# Patient Record
Sex: Male | Born: 1985
Health system: Southern US, Community
[De-identification: ages and names within clinical notes are randomized; demographics above are authoritative.]

## PROBLEM LIST (undated history)

## (undated) ENCOUNTER — Ambulatory Visit (HOSPITAL_COMMUNITY): Admission: EM | Payer: Medicaid Other | Source: Home / Self Care

## (undated) HISTORY — PX: NO PAST SURGERIES: SHX2092

---

## 2017-10-18 ENCOUNTER — Encounter: Payer: Self-pay | Admitting: Emergency Medicine

## 2017-10-18 ENCOUNTER — Emergency Department: Payer: Self-pay

## 2017-10-18 ENCOUNTER — Emergency Department
Admission: EM | Admit: 2017-10-18 | Discharge: 2017-10-18 | Disposition: A | Discharge status: Home / Self Care | Payer: Self-pay | Attending: Emergency Medicine | Admitting: Emergency Medicine

## 2017-10-18 DIAGNOSIS — Y929 Unspecified place or not applicable: Secondary | ICD-10-CM | POA: Insufficient documentation

## 2017-10-18 DIAGNOSIS — S62355A Nondisplaced fracture of shaft of fourth metacarpal bone, left hand, initial encounter for closed fracture: Secondary | ICD-10-CM | POA: Insufficient documentation

## 2017-10-18 DIAGNOSIS — Y9389 Activity, other specified: Secondary | ICD-10-CM | POA: Insufficient documentation

## 2017-10-18 DIAGNOSIS — F17200 Nicotine dependence, unspecified, uncomplicated: Secondary | ICD-10-CM | POA: Insufficient documentation

## 2017-10-18 DIAGNOSIS — R2232 Localized swelling, mass and lump, left upper limb: Secondary | ICD-10-CM | POA: Insufficient documentation

## 2017-10-18 DIAGNOSIS — Y998 Other external cause status: Secondary | ICD-10-CM | POA: Insufficient documentation

## 2017-10-18 NOTE — Discharge Instructions (Addendum)
You have broken the bone in the hand. Wear the splint until you are cleared by ortho. Keep the splint clean and dry. Take OTC ibuprofen as needed for pain relief.
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
## 2018-06-18 ENCOUNTER — Emergency Department (HOSPITAL_COMMUNITY)
Admission: EM | Admit: 2018-06-18 | Discharge: 2018-06-18 | Disposition: A | Discharge status: Home / Self Care | Payer: Self-pay | Attending: Emergency Medicine | Admitting: Emergency Medicine

## 2018-06-18 ENCOUNTER — Encounter (HOSPITAL_COMMUNITY): Payer: Self-pay | Admitting: *Deleted

## 2018-06-18 ENCOUNTER — Other Ambulatory Visit: Payer: Self-pay

## 2018-06-18 DIAGNOSIS — F17200 Nicotine dependence, unspecified, uncomplicated: Secondary | ICD-10-CM | POA: Insufficient documentation

## 2018-06-18 DIAGNOSIS — L600 Ingrowing nail: Secondary | ICD-10-CM | POA: Insufficient documentation

## 2018-06-18 MED ORDER — LIDOCAINE HCL 2 % IJ SOLN
20.0000 mL | Freq: Once | INTRAMUSCULAR | Status: DC
  Filled 2018-06-18: qty 20

## 2018-06-18 NOTE — ED Triage Notes (Signed)
C/o pain right great toe, thinks he has an ingrown toe nail for over a year. States no change in pain just wants to see whats is wrong

## 2018-06-18 NOTE — Discharge Instructions (Addendum)
Wash daily with soap and water. Reapply a new dressing each day.  Use tylenol, ibuprofen, and ice to help with pain and swelling.  Follow up with the foot doctor if your pain continues, if you have recurrent ingrown toenails.  Return to the ER with any new, worsening, or concerning symptoms.

## 2018-06-18 NOTE — ED Provider Notes (Signed)
[facility] EMERGENCY DEPARTMENT Provider Note   CSN: [id number] Arrival date & time: 06/18/18  [id number]    History   Chief Complaint Chief Complaint  Patient presents with  . Toe Pain    HPI Bobby Jenkins is a 33 y.o. male presenting for evaluation of right great toe pain.  Patient states possible years he has had persistent pain of his right great toenail, along the medial edge.  He states over the past several days, pain has worsened, and when he is walking he has shooting pain that goes from his toe to his midfoot on the dorsal aspect.  He denies pus draining from the area.  He denies fevers, chills, streaking.  He denies numbness or tingling.  He denies pain of other toes.  He has no other medical problems, takes no medications daily.  He is not on blood thinners.     HPI  History reviewed. No pertinent past medical history.  There are no active problems to display for this patient.   History reviewed. No pertinent surgical history.      Home Medications    Prior to Admission medications   Not on File    Family History No family history on file.  Social History Social History   Tobacco Use  . Smoking status: Current Every Day Smoker  . Smokeless tobacco: Never Used  Substance Use Topics  . Alcohol use: Not Currently  . Drug use: Never     Allergies   Patient has no allergy information on record.   Review of Systems Review of Systems  Musculoskeletal:       R toe pain  Hematological: Does not bruise/bleed easily.     Physical Exam Updated Vital Signs BP 122/70 (BP Location: Right Arm)   Pulse 62   Temp 97.7 F (36.5 C) (Oral)   Resp 16   Ht 5\' 9"  (1.753 m)   Wt 79.4 kg   SpO2 100%   BMI 25.84 kg/m   Physical Exam Vitals signs and nursing note reviewed.  Constitutional:      General: He is not in acute distress.    Appearance: He is well-developed.  HENT:     Head: Normocephalic and atraumatic.  Neck:   Musculoskeletal: Normal range of motion.  Pulmonary:     Effort: Pulmonary effort is normal.  Abdominal:     General: There is no distension.  Musculoskeletal: Normal range of motion.     Comments: Mild tenderness palpation of the extensor tendon of the R great toe.  No erythema or warmth.  Pedal pulses intact bilaterally.  Good cap refill of all toes distally.  Good distal sensation.  Skin:    General: Skin is warm.     Capillary Refill: Capillary refill takes less than 2 seconds.     Findings: Erythema present. No rash.     Comments: Erythema and tenderness along the medial aspect of the right great toe.  Neurological:     Mental Status: He is alert and oriented to person, place, and time.      ED Treatments / Results  Labs (all labs ordered are listed, but only abnormal results are displayed) Labs Reviewed - No data to display  EKG None  Radiology No results found.  Procedures Excise ingrown toenail Date/Time: 06/18/2018 1:57 PM Performed by: Alveria Apleyaccavale, Zeven Kocak, PA-C Authorized by: Alveria Apleyaccavale, Conner Neiss, PA-C  Consent: Verbal consent obtained. Risks and benefits: risks, benefits and alternatives were discussed Consent given by: patient Patient  understanding: patient states understanding of the procedure being performed Patient consent: the patient's understanding of the procedure matches consent given Procedure consent: procedure consent matches procedure scheduled Patient identity confirmed: verbally with patient Preparation: Patient was prepped and draped in the usual sterile fashion. Local anesthesia used: yes Anesthesia: digital block  Anesthesia: Local anesthesia used: yes Local Anesthetic: lidocaine 2% without epinephrine Anesthetic total: 6 mL  Sedation: Patient sedated: no  Patient tolerance: Patient tolerated the procedure well with no immediate complications Comments: Digital block performed using ring technique. Nail separated from nail bed using suture  scissors, and medial aspect of the nail removed.    (including critical care time)  Medications Ordered in ED Medications  lidocaine (XYLOCAINE) 2 % (with pres) injection 400 mg (has no administration in time range)     Initial Impression / Assessment and Plan / ED Course  I have reviewed the triage vital signs and the nursing notes.  Pertinent labs & imaging results that were available during my care of the patient were reviewed by me and considered in my medical decision making (see chart for details).        Patient presenting for evaluation of ingrown toenail on the right great toe.  Physical exam reassuring, he is neurovascularly intact.  Additionally, he is having shooting pain to the dorsal aspect of his foot.  Pain is reproducible with palpation of the extensor tendon.  I think this is likely an overuse injury, as patient states he is walking abnormally due to his right great toe pain.  Without a fall or injury, doubt fracture dislocation, I do not believe x-rays would be beneficial.  Ingrown toenail removed as described above.  Patient tolerated well.  Discussed likely overuse injury due to walking abnormally, and he can treat this with Tylenol/ibuprofen.  Discussed aftercare instructions, and follow-up with podiatry.  At this time, patient appears safe for discharge.  Return precautions given.  Patient states he understands agrees plan.   Final Clinical Impressions(s) / ED Diagnoses   Final diagnoses:  Ingrown toenail    ED Discharge Orders    None       Franchot Heidelberg, PA-C 06/18/18 1400    Sherwood Gambler, MD 06/21/18 [phone]

## 2018-09-27 ENCOUNTER — Emergency Department (HOSPITAL_COMMUNITY)
Admission: EM | Admit: 2018-09-27 | Discharge: 2018-09-27 | Disposition: A | Discharge status: Home / Self Care | Payer: Self-pay | Attending: Emergency Medicine | Admitting: Emergency Medicine

## 2018-09-27 ENCOUNTER — Encounter (HOSPITAL_COMMUNITY): Payer: Self-pay | Admitting: Emergency Medicine

## 2018-09-27 ENCOUNTER — Emergency Department (HOSPITAL_COMMUNITY): Payer: Self-pay

## 2018-09-27 ENCOUNTER — Other Ambulatory Visit: Payer: Self-pay

## 2018-09-27 DIAGNOSIS — R0789 Other chest pain: Secondary | ICD-10-CM | POA: Insufficient documentation

## 2018-09-27 DIAGNOSIS — F17200 Nicotine dependence, unspecified, uncomplicated: Secondary | ICD-10-CM | POA: Insufficient documentation

## 2018-09-27 LAB — PROTIME-INR
INR: 0.9 (ref 0.8–1.2)
Prothrombin Time: 12 seconds (ref 11.4–15.2)

## 2018-09-27 LAB — BASIC METABOLIC PANEL
Anion gap: 11 (ref 5–15)
BUN: 12 mg/dL (ref 6–20)
CO2: 24 mmol/L (ref 22–32)
Calcium: 9.1 mg/dL (ref 8.9–10.3)
Chloride: 105 mmol/L (ref 98–111)
Creatinine, Ser: 1.22 mg/dL (ref 0.61–1.24)
GFR calc Af Amer: >60 mL/min (ref >60)
GFR calc non Af Amer: >60 mL/min (ref >60)
Glucose, Bld: 102 mg/dL — ABNORMAL HIGH (ref 70–99)
Potassium: 3.8 mmol/L (ref 3.5–5.1)
Sodium: 140 mmol/L (ref 135–145)

## 2018-09-27 LAB — CBC
HCT: 44.9 % (ref 39.0–52.0)
Hemoglobin: 13.9 g/dL (ref 13.0–17.0)
MCH: 26.7 pg (ref 26.0–34.0)
MCHC: 31 g/dL (ref 30.0–36.0)
MCV: 86.3 fL (ref 80.0–100.0)
Platelets: 218 10*3/uL (ref 150–400)
RBC: 5.2 MIL/uL (ref 4.22–5.81)
RDW: 13.3 % (ref 11.5–15.5)
WBC: 8.2 10*3/uL (ref 4.0–10.5)
nRBC: 0 % (ref 0.0–0.2)

## 2018-09-27 LAB — TROPONIN I (HIGH SENSITIVITY)
Troponin I (High Sensitivity): 3 ng/L (ref <18)
Troponin I (High Sensitivity): 3 ng/L (ref <18)

## 2018-09-27 MED ORDER — IBUPROFEN 800 MG PO TABS: 800.0000 mg | ORAL_TABLET | Freq: Three times a day (TID) | ORAL | 0 refills | Status: DC | PRN

## 2018-09-27 NOTE — ED Notes (Signed)
Pt given dc instructions pt verbalizes understanding.  

## 2018-09-27 NOTE — ED Triage Notes (Signed)
Patient reports intermittent left mild chest pain for 1 week , denies SOB , no emesis or diaphoresis , rates pain 2/10 at triage , patient added bilateral feet swelling today .

## 2018-09-27 NOTE — Discharge Instructions (Addendum)
Your testing here today did not show any significant abnormalities.  I feel that this is pain in the chest wall itself and not from your heart.  Return here for any worsening in your condition.  You can use ice and heat over the areas that are sore.  The testing on your heart did not show any heart related damage today.

## 2018-09-30 NOTE — ED Provider Notes (Signed)
[facility]ERGENC[facility] Provider Note   CSN: [id number] Arrival date & time: 09/27/18  0113     History   Chief Complaint Chief Complaint  Patient presents with  . Chest Pain    HPI Stevan BornMarquis Dimattia is a 33 y.o. male.     HPI Patient presents to the emergency department with left-sided chest pain over the last week.  The patient denies any other symptoms such as shortness of breath emesis or diaphoresis.  The patient states his pain is about a 2 out of 10.  He states that certain movements will make the pain worse.  The patient states that he noticed that he had swelling in his feet several days ago but that is resolved.  Patient states that nothing seems to make the condition better.  He states he did not take any medications prior to arrival for his symptoms.  Patient states he does not smoke and has no known risk factors for cardiac disease.  The patient denies shortness of breath, headache,blurred vision, neck pain, fever, cough, weakness, numbness, dizziness, anorexia, edema, abdominal pain, nausea, vomiting, diarrhea, rash, back pain, dysuria, hematemesis, bloody stool, near syncope, or syncope. History reviewed. No pertinent past medical history.  There are no active problems to display for this patient.   History reviewed. No pertinent surgical history.      Home Medications    Prior to Admission medications   Medication Sig Start Date End Date Taking? Authorizing Provider  ibuprofen (ADVIL) 800 MG tablet Take 1 tablet (800 mg total) by mouth every 8 (eight) hours as needed. 09/27/18   Nasha Diss, Cristal Deerhristopher, PA-C    Family History No family history on file.  Social History Social History   Tobacco Use  . Smoking status: Current Every Day Smoker  . Smokeless tobacco: Never Used  Substance Use Topics  . Alcohol use: Not Currently  . Drug use: Never     Allergies   Patient has no known allergies.   Review of Systems Review of  Systems All other systems negative except as documented in the HPI. All pertinent positives and negatives as reviewed in the HPI.  Physical Exam Updated Vital Signs BP 118/64 (BP Location: Right Arm)   Pulse (!) 50   Temp 98.3 F (36.8 C) (Oral)   Resp 16   Ht 5\' 9"  (1.753 m)   Wt 85 kg   SpO2 97%   BMI 27.67 kg/m   Physical Exam Vitals signs and nursing note reviewed.  Constitutional:      General: He is not in acute distress.    Appearance: He is well-developed.  HENT:     Head: Normocephalic and atraumatic.  Eyes:     Pupils: Pupils are equal, round, and reactive to light.  Neck:     Musculoskeletal: Normal range of motion and neck supple.  Cardiovascular:     Rate and Rhythm: Normal rate and regular rhythm.     Heart sounds: Normal heart sounds. No murmur. No friction rub. No gallop.   Pulmonary:     Effort: Pulmonary effort is normal. No respiratory distress.     Breath sounds: Normal breath sounds. No wheezing or rhonchi.  Chest:     Chest wall: Tenderness present.  Abdominal:     General: Bowel sounds are normal. There is no distension.     Palpations: Abdomen is soft.     Tenderness: There is no abdominal tenderness.  Skin:    General: Skin is warm  and dry.     Capillary Refill: Capillary refill takes less than 2 seconds.     Findings: No erythema or rash.  Neurological:     Mental Status: He is alert and oriented to person, place, and time.     Motor: No abnormal muscle tone.     Coordination: Coordination normal.  Psychiatric:        Behavior: Behavior normal.      ED Treatments / Results  Labs (all labs ordered are listed, but only abnormal results are displayed) Labs Reviewed  BASIC METABOLIC PANEL - Abnormal; Notable for the following components:      Result Value   Glucose, Bld 102 (*)    All other components within normal limits  CBC  PROTIME-INR  TROPONIN I (HIGH SENSITIVITY)  TROPONIN I (HIGH SENSITIVITY)    EKG EKG Interpretation   Date/Time:  Thursday September 27 2018 01:18:15 EDT Ventricular Rate:  55 PR Interval:  132 QRS Duration: 106 QT Interval:  386 QTC Calculation: 369 R Axis:   47 Text Interpretation:  Sinus bradycardia Septal infarct , age undetermined T wave abnormality, consider inferior ischemia Abnormal ECG No old tracing to compare Confirmed by Isla Pence [phone]) on 09/27/2018 8:00:18 AM   Radiology No results found.  Procedures Procedures (including critical care time)  Medications Ordered in ED Medications - No data to display   Initial Impression / Assessment and Plan / ED Course  I have reviewed the triage vital signs and the nursing notes.  Pertinent labs & imaging results that were available during my care of the patient were reviewed by me and considered in my medical decision making (see chart for details).        Patient does have chest wall tenderness on palpation.  I feel he is a chest wall strain.  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  I feel that the patient will need to follow-up with a primary care doctor.  He is given treatment for chest wall strain.  I advised the patient to return here for any worsening his condition.  His enzymes for cardiac injury were negative here today.  EKG does not show any acute abnormalities.  Final Clinical Impressions(s) / ED Diagnoses   Final diagnoses:  Atypical chest pain  Chest wall pain    ED Discharge Orders         Ordered    ibuprofen (ADVIL) 800 MG tablet  Every 8 hours PRN     09/27/18 0824           Dalia Heading, PA-C 10/01/18 0000    Isla Pence, MD 10/02/18 [phone]

## 2019-03-09 ENCOUNTER — Encounter (HOSPITAL_COMMUNITY): Payer: Self-pay | Admitting: Emergency Medicine

## 2019-03-09 ENCOUNTER — Other Ambulatory Visit: Payer: Self-pay

## 2019-03-09 ENCOUNTER — Emergency Department (HOSPITAL_COMMUNITY)
Admission: EM | Admit: 2019-03-09 | Discharge: 2019-03-09 | Disposition: A | Discharge status: Home / Self Care | Payer: Self-pay | Attending: Emergency Medicine | Admitting: Emergency Medicine

## 2019-03-09 DIAGNOSIS — R7401 Elevation of levels of liver transaminase levels: Secondary | ICD-10-CM

## 2019-03-09 DIAGNOSIS — F17200 Nicotine dependence, unspecified, uncomplicated: Secondary | ICD-10-CM | POA: Insufficient documentation

## 2019-03-09 DIAGNOSIS — R1110 Vomiting, unspecified: Secondary | ICD-10-CM | POA: Insufficient documentation

## 2019-03-09 DIAGNOSIS — R748 Abnormal levels of other serum enzymes: Secondary | ICD-10-CM | POA: Insufficient documentation

## 2019-03-09 DIAGNOSIS — R197 Diarrhea, unspecified: Secondary | ICD-10-CM | POA: Insufficient documentation

## 2019-03-09 DIAGNOSIS — N289 Disorder of kidney and ureter, unspecified: Secondary | ICD-10-CM | POA: Insufficient documentation

## 2019-03-09 LAB — COMPREHENSIVE METABOLIC PANEL WITH GFR
ALT: 53 U/L — ABNORMAL HIGH (ref 0–44)
AST: 30 U/L (ref 15–41)
Albumin: 4 g/dL (ref 3.5–5.0)
Alkaline Phosphatase: 54 U/L (ref 38–126)
Anion gap: 8 (ref 5–15)
BUN: 12 mg/dL (ref 6–20)
CO2: 26 mmol/L (ref 22–32)
Calcium: 9 mg/dL (ref 8.9–10.3)
Chloride: 106 mmol/L (ref 98–111)
Creatinine, Ser: 1.32 mg/dL — ABNORMAL HIGH (ref 0.61–1.24)
GFR calc Af Amer: >60 mL/min
GFR calc non Af Amer: >60 mL/min
Glucose, Bld: 120 mg/dL — ABNORMAL HIGH (ref 70–99)
Potassium: 3.5 mmol/L (ref 3.5–5.1)
Sodium: 140 mmol/L (ref 135–145)
Total Bilirubin: 0.6 mg/dL (ref 0.3–1.2)
Total Protein: 6.5 g/dL (ref 6.5–8.1)

## 2019-03-09 LAB — CBC
HCT: 45.6 % (ref 39.0–52.0)
Hemoglobin: 14.5 g/dL (ref 13.0–17.0)
MCH: 27.1 pg (ref 26.0–34.0)
MCHC: 31.8 g/dL (ref 30.0–36.0)
MCV: 85.2 fL (ref 80.0–100.0)
Platelets: 225 10*3/uL (ref 150–400)
RBC: 5.35 MIL/uL (ref 4.22–5.81)
RDW: 13.4 % (ref 11.5–15.5)
WBC: 8.1 10*3/uL (ref 4.0–10.5)
nRBC: 0 % (ref 0.0–0.2)

## 2019-03-09 MED ORDER — SODIUM CHLORIDE 0.9% FLUSH: 3.0000 mL | Freq: Once | INTRAVENOUS | Status: DC

## 2019-03-09 NOTE — ED Notes (Signed)
Patient verbalizes understanding of discharge instructions. Opportunity for questioning and answers were provided. Armband removed by staff, pt discharged from ED. Ambulated out to lobby  

## 2019-03-09 NOTE — ED Provider Notes (Signed)
[facility] EMERGENCY DEPARTMENT Provider Note   CSN: [id number] Arrival date & time: 03/09/19  0142   History Chief Complaint  Patient presents with  . Emesis  . Diarrhea    Bobby Jenkins is a 34 y.o. male.  The history is provided by the patient.  Emesis Associated symptoms: diarrhea   Diarrhea Associated symptoms: vomiting   He complains of diarrhea for the last month.  He will have up to 4 stools an hour, and has had nocturnal diarrhea as well.  No has been no blood or mucus.  There has been some intermittent nausea and some intermittent abdominal cramping.  He denies fever or chills.  He tried taking a laxative, but it did not help.  He has not used a laxative for the last 10 days.  He states that his diet is dairy free.  History reviewed. No pertinent past medical history.  There are no problems to display for this patient.   History reviewed. No pertinent surgical history.     No family history on file.  Social History   Tobacco Use  . Smoking status: Current Every Day Smoker  . Smokeless tobacco: Never Used  Substance Use Topics  . Alcohol use: Not Currently  . Drug use: Never    Home Medications Prior to Admission medications   Medication Sig Start Date End Date Taking? Authorizing Provider  ibuprofen (ADVIL) 800 MG tablet Take 1 tablet (800 mg total) by mouth every 8 (eight) hours as needed. 09/27/18   Lawyer, Cristal Deer, PA-C    Allergies    Patient has no known allergies.  Review of Systems   Review of Systems  Gastrointestinal: Positive for diarrhea and vomiting.  All other systems reviewed and are negative.   Physical Exam Updated Vital Signs BP 128/77 (BP Location: Left Arm)   Pulse 63   Temp 98.1 F (36.7 C) (Oral)   Resp 16   Ht 5\' 9"  (1.753 m)   Wt 80 kg   SpO2 99%   BMI 26.05 kg/m   Physical Exam Vitals and nursing note reviewed.   34 year old male, resting comfortably and in no acute distress. Vital signs  are normal. Oxygen saturation is 99%, which is normal. Head is normocephalic and atraumatic. PERRLA, EOMI. Oropharynx is clear. Neck is nontender and supple without adenopathy or JVD. Back is nontender and there is no CVA tenderness. Lungs are clear without rales, wheezes, or rhonchi. Chest is nontender. Heart has regular rate and rhythm without murmur. Abdomen is soft, flat, nontender without masses or hepatosplenomegaly and peristalsis is normoactive. Extremities have no cyanosis or edema, full range of motion is present. Skin is warm and dry without rash. Neurologic: Mental status is normal, cranial nerves are intact, there are no motor or sensory deficits.  ED Results / Procedures / Treatments   Labs (all labs ordered are listed, but only abnormal results are displayed) Labs Reviewed  COMPREHENSIVE METABOLIC PANEL - Abnormal; Notable for the following components:      Result Value   Glucose, Bld 120 (*)    Creatinine, Ser 1.32 (*)    ALT 53 (*)    All other components within normal limits  CBC  URINALYSIS, ROUTINE W REFLEX MICROSCOPIC   Procedures Procedures  Medications Ordered in ED Medications  sodium chloride flush (NS) 0.9 % injection 3 mL (3 mLs Intravenous Not Given 03/09/19 0231)    ED Course  I have reviewed the triage vital signs and the nursing  notes.  Pertinent lab results that were available during my care of the patient were reviewed by me and considered in my medical decision making (see chart for details).  MDM Rules/Calculators/A&P Subacute diarrhea, cause uncertain.  Duration of symptoms is too long for viral enteritis to be the cause.  Most likely problem would be dietary intolerance.  He states he is already lactose free, so I have recommended that he try avoiding gluten.  Other diagnostic possibilities include inflammatory bowel disease.  Labs show mild renal insufficiency and mild elevation of ALT of doubtful clinical significance.  Old records are  reviewed, and he has no relevant past visits.  He is advised to use loperamide to try to slow his bowel movements down, follow-up with gastroenterology for further outpatient work-up.  Return precautions discussed.  Final Clinical Impression(s) / ED Diagnoses Final diagnoses:  Diarrhea, unspecified type  Elevated ALT measurement  Renal insufficiency    Rx / DC Orders ED Discharge Orders    None       Delora Fuel, MD 95/18/84 [phone]

## 2019-03-09 NOTE — ED Triage Notes (Signed)
Patient reports diarrhea and emesis for several week , denies abdominal pain , no fever or chills.

## 2019-03-09 NOTE — Discharge Instructions (Signed)
The cause of your diet is not clear.  However, you should refrain from using laxatives of any type.  He may have an intolerance to certain foods.  Try avoiding gluten for 1 week to see if that gives you any improvement.  Take loperamide (Imodium AD) as needed for diarrhea.  Follow-up with the gastroenterology office for further evaluation of your diarrhea.  Return if you start passing blood, start having severe abdominal pain, or start running a fever.

## 2019-11-23 ENCOUNTER — Emergency Department (HOSPITAL_COMMUNITY): Payer: Self-pay

## 2019-11-23 ENCOUNTER — Encounter (HOSPITAL_COMMUNITY): Payer: Self-pay | Admitting: Emergency Medicine

## 2019-11-23 ENCOUNTER — Other Ambulatory Visit: Payer: Self-pay

## 2019-11-23 ENCOUNTER — Emergency Department (HOSPITAL_COMMUNITY)
Admission: EM | Admit: 2019-11-23 | Discharge: 2019-11-23 | Disposition: A | Discharge status: Home / Self Care | Payer: Self-pay | Attending: Emergency Medicine | Admitting: Emergency Medicine

## 2019-11-23 DIAGNOSIS — M79602 Pain in left arm: Secondary | ICD-10-CM | POA: Diagnosis not present

## 2019-11-23 DIAGNOSIS — F17290 Nicotine dependence, other tobacco product, uncomplicated: Secondary | ICD-10-CM | POA: Insufficient documentation

## 2019-11-23 DIAGNOSIS — M25552 Pain in left hip: Secondary | ICD-10-CM | POA: Insufficient documentation

## 2019-11-23 DIAGNOSIS — R059 Cough, unspecified: Secondary | ICD-10-CM | POA: Insufficient documentation

## 2019-11-23 DIAGNOSIS — T1490XA Injury, unspecified, initial encounter: Secondary | ICD-10-CM

## 2019-11-23 DIAGNOSIS — M542 Cervicalgia: Secondary | ICD-10-CM | POA: Insufficient documentation

## 2019-11-23 DIAGNOSIS — K760 Fatty (change of) liver, not elsewhere classified: Secondary | ICD-10-CM

## 2019-11-23 DIAGNOSIS — R0789 Other chest pain: Secondary | ICD-10-CM | POA: Insufficient documentation

## 2019-11-23 DIAGNOSIS — R1030 Lower abdominal pain, unspecified: Secondary | ICD-10-CM | POA: Insufficient documentation

## 2019-11-23 LAB — COMPREHENSIVE METABOLIC PANEL
ALT: 89 U/L — ABNORMAL HIGH (ref 0–44)
AST: 43 U/L — ABNORMAL HIGH (ref 15–41)
Albumin: 4.4 g/dL (ref 3.5–5.0)
Alkaline Phosphatase: 53 U/L (ref 38–126)
Anion gap: 10 (ref 5–15)
BUN: <5 mg/dL — ABNORMAL LOW (ref 6–20)
CO2: 28 mmol/L (ref 22–32)
Calcium: 9.6 mg/dL (ref 8.9–10.3)
Chloride: 104 mmol/L (ref 98–111)
Creatinine, Ser: 1.24 mg/dL (ref 0.61–1.24)
GFR, Estimated: >60 mL/min (ref >60)
Glucose, Bld: 98 mg/dL (ref 70–99)
Potassium: 3.4 mmol/L — ABNORMAL LOW (ref 3.5–5.1)
Sodium: 142 mmol/L (ref 135–145)
Total Bilirubin: 0.7 mg/dL (ref 0.3–1.2)
Total Protein: 7.2 g/dL (ref 6.5–8.1)

## 2019-11-23 LAB — CBC WITH DIFFERENTIAL/PLATELET
Abs Immature Granulocytes: 0.05 10*3/uL (ref 0.00–0.07)
Basophils Absolute: 0.1 10*3/uL (ref 0.0–0.1)
Basophils Relative: 1 %
Eosinophils Absolute: 0.1 10*3/uL (ref 0.0–0.5)
Eosinophils Relative: 0 %
HCT: 47.5 % (ref 39.0–52.0)
Hemoglobin: 14.8 g/dL (ref 13.0–17.0)
Immature Granulocytes: 0 %
Lymphocytes Relative: 15 %
Lymphs Abs: 1.9 10*3/uL (ref 0.7–4.0)
MCH: 26.4 pg (ref 26.0–34.0)
MCHC: 31.2 g/dL (ref 30.0–36.0)
MCV: 84.7 fL (ref 80.0–100.0)
Monocytes Absolute: 0.8 10*3/uL (ref 0.1–1.0)
Monocytes Relative: 6 %
Neutro Abs: 10.4 10*3/uL — ABNORMAL HIGH (ref 1.7–7.7)
Neutrophils Relative %: 78 %
Platelets: 220 10*3/uL (ref 150–400)
RBC: 5.61 MIL/uL (ref 4.22–5.81)
RDW: 13.2 % (ref 11.5–15.5)
WBC: 13.3 10*3/uL — ABNORMAL HIGH (ref 4.0–10.5)
nRBC: 0 % (ref 0.0–0.2)

## 2019-11-23 LAB — SAMPLE TO BLOOD BANK

## 2019-11-23 MED ORDER — METHOCARBAMOL 500 MG PO TABS: 500.0000 mg | ORAL_TABLET | Freq: Three times a day (TID) | ORAL | 0 refills | Status: DC | PRN

## 2019-11-23 MED ORDER — IOHEXOL 300 MG/ML  SOLN
100.0000 mL | Freq: Once | INTRAMUSCULAR | Status: AC | PRN
  Administered 2019-11-23: 100 mL via INTRAVENOUS

## 2019-11-23 MED ORDER — FENTANYL CITRATE (PF) 100 MCG/2ML IJ SOLN
50.0000 ug | Freq: Once | INTRAMUSCULAR | Status: AC
  Administered 2019-11-23: 50 ug via INTRAVENOUS
  Filled 2019-11-23: qty 2

## 2019-11-23 NOTE — ED Notes (Addendum)
Pt provided [place] sandwich & discharge paperwork. Patient verbalizes understanding of discharge instructions. Opportunity for questioning and answers were provided.
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
## 2019-11-23 NOTE — ED Notes (Signed)
Pt to xray
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
## 2019-11-23 NOTE — ED Notes (Signed)
Pt to CT via stretcher

## 2019-11-23 NOTE — Discharge Instructions (Addendum)

## 2019-12-02 ENCOUNTER — Other Ambulatory Visit: Payer: Self-pay

## 2019-12-02 ENCOUNTER — Ambulatory Visit: Payer: Self-pay | Attending: Nurse Practitioner | Admitting: Nurse Practitioner

## 2019-12-02 ENCOUNTER — Other Ambulatory Visit: Payer: Self-pay | Admitting: Nurse Practitioner

## 2019-12-02 ENCOUNTER — Encounter: Payer: Self-pay | Admitting: Nurse Practitioner

## 2019-12-02 VITALS — Ht 69.0 in | Wt 170.0 lb

## 2019-12-02 DIAGNOSIS — K760 Fatty (change of) liver, not elsewhere classified: Secondary | ICD-10-CM

## 2019-12-02 DIAGNOSIS — D72829 Elevated white blood cell count, unspecified: Secondary | ICD-10-CM

## 2019-12-02 DIAGNOSIS — Z7689 Persons encountering health services in other specified circumstances: Secondary | ICD-10-CM

## 2019-12-02 DIAGNOSIS — R748 Abnormal levels of other serum enzymes: Secondary | ICD-10-CM

## 2019-12-02 DIAGNOSIS — Z1159 Encounter for screening for other viral diseases: Secondary | ICD-10-CM

## 2019-12-02 DIAGNOSIS — Z09 Encounter for follow-up examination after completed treatment for conditions other than malignant neoplasm: Secondary | ICD-10-CM

## 2019-12-02 DIAGNOSIS — Z114 Encounter for screening for human immunodeficiency virus [HIV]: Secondary | ICD-10-CM

## 2019-12-02 DIAGNOSIS — E876 Hypokalemia: Secondary | ICD-10-CM

## 2019-12-02 MED ORDER — TIZANIDINE HCL 6 MG PO CAPS: 6.0000 mg | ORAL_CAPSULE | Freq: Three times a day (TID) | ORAL | 1 refills | Status: DC

## 2019-12-02 MED ORDER — IBUPROFEN 800 MG PO TABS: 800.0000 mg | ORAL_TABLET | Freq: Three times a day (TID) | ORAL | 1 refills | Status: DC | PRN

## 2019-12-02 MED FILL — tiZANidine HCL 6 MG CAPS: 6 | 20 days supply | Qty: 60 | Fill #0

## 2019-12-02 MED FILL — IBUPROFEN 800 MG TABLET: 800 | 20 days supply | Qty: 60 | Fill #0
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
## 2019-12-03 LAB — CBC WITH DIFFERENTIAL/PLATELET
Basophils Absolute: 0.1 10*3/uL (ref 0.0–0.2)
Basos: 1 %
EOS (ABSOLUTE): 0.1 10*3/uL (ref 0.0–0.4)
Eos: 1 %
Hematocrit: 45.7 % (ref 37.5–51.0)
Hemoglobin: 15.1 g/dL (ref 13.0–17.7)
Immature Grans (Abs): 0 10*3/uL (ref 0.0–0.1)
Immature Granulocytes: 0 %
Lymphocytes Absolute: 2 10*3/uL (ref 0.7–3.1)
Lymphs: 28 %
MCH: 27 pg (ref 26.6–33.0)
MCHC: 33 g/dL (ref 31.5–35.7)
MCV: 82 fL (ref 79–97)
Monocytes Absolute: 0.4 10*3/uL (ref 0.1–0.9)
Monocytes: 5 %
Neutrophils Absolute: 4.6 10*3/uL (ref 1.4–7.0)
Neutrophils: 65 %
Platelets: 252 10*3/uL (ref 150–450)
RBC: 5.6 x10E6/uL (ref 4.14–5.80)
RDW: 12.8 % (ref 11.6–15.4)
WBC: 7.1 10*3/uL (ref 3.4–10.8)

## 2019-12-03 LAB — LIPID PANEL
Chol/HDL Ratio: 5 ratio (ref 0.0–5.0)
Cholesterol, Total: 225 mg/dL — ABNORMAL HIGH (ref 100–199)
HDL: 45 mg/dL (ref >39)
LDL Chol Calc (NIH): 164 mg/dL — ABNORMAL HIGH (ref 0–99)
Triglycerides: 91 mg/dL (ref 0–149)
VLDL Cholesterol Cal: 16 mg/dL (ref 5–40)

## 2019-12-03 LAB — BASIC METABOLIC PANEL
BUN/Creatinine Ratio: 8 — ABNORMAL LOW (ref 9–20)
BUN: 10 mg/dL (ref 6–20)
CO2: 20 mmol/L (ref 20–29)
Calcium: 9.7 mg/dL (ref 8.7–10.2)
Chloride: 104 mmol/L (ref 96–106)
Creatinine, Ser: 1.22 mg/dL (ref 0.76–1.27)
GFR calc Af Amer: 88 mL/min/{1.73_m2} (ref >59)
GFR calc non Af Amer: 76 mL/min/{1.73_m2} (ref >59)
Glucose: 115 mg/dL — ABNORMAL HIGH (ref 65–99)
Potassium: 4.4 mmol/L (ref 3.5–5.2)
Sodium: 141 mmol/L (ref 134–144)

## 2019-12-03 LAB — HCV AB W REFLEX TO QUANT PCR: HCV Ab: <0.1 s/co ratio (ref 0.0–0.9)

## 2019-12-03 LAB — HCV INTERPRETATION

## 2019-12-03 LAB — HIV ANTIBODY (ROUTINE TESTING W REFLEX): HIV Screen 4th Generation wRfx: NONREACTIVE

## 2019-12-04 ENCOUNTER — Encounter: Payer: Self-pay | Admitting: Nurse Practitioner

## 2020-01-15 ENCOUNTER — Encounter: Payer: Self-pay | Admitting: Nurse Practitioner

## 2020-01-29 ENCOUNTER — Telehealth: Payer: Medicaid Other | Admitting: Family

## 2020-01-29 ENCOUNTER — Other Ambulatory Visit: Payer: Self-pay | Admitting: Family

## 2020-01-29 DIAGNOSIS — U071 COVID-19: Secondary | ICD-10-CM

## 2020-01-29 MED ORDER — ALBUTEROL SULFATE HFA 108 (90 BASE) MCG/ACT IN AERS: 2.0000 | INHALATION_SPRAY | Freq: Four times a day (QID) | RESPIRATORY_TRACT | 0 refills | Status: DC | PRN

## 2020-01-29 MED ORDER — FLUTICASONE PROPIONATE 50 MCG/ACT NA SUSP: 2.0000 | Freq: Every day | NASAL | 6 refills | Status: DC

## 2020-01-29 MED ORDER — BENZONATATE 100 MG PO CAPS: 100.0000 mg | ORAL_CAPSULE | Freq: Three times a day (TID) | ORAL | 0 refills | Status: DC | PRN

## 2020-01-29 MED FILL — BENZONATATE 100 MG CAPS: 100 | 6 days supply | Qty: 20 | Fill #0

## 2020-01-29 MED FILL — ALBUTEROL SULFATE HFA 108 (: 108 (90 BAS | 25 days supply | Qty: 18 | Fill #0

## 2020-01-29 MED FILL — FLUTICASONE PROP 50 MCG SPR: 50 | 30 days supply | Qty: 16 | Fill #0

## 2020-01-29 NOTE — Progress Notes (Signed)
E-Visit for Corona Virus Screening  We are sorry you are not feeling well. We are here to help!  You have tested positive for COVID-19, meaning that you were infected with the novel coronavirus and could give the virus to others.  It is vitally important that you stay home so you do not spread it to others.      Please continue isolation at home, for at least 10 days since the start of your symptoms and until you have had 24 hours with no fever (without taking a fever reducer) and with improving of symptoms.  If you have no symptoms but tested positive (or all symptoms resolve after 5 days and you have no fever) you can leave your house but continue to wear a mask around others for an additional 5 days. If you have a fever,continue to stay home until you have had 24 hours of no fever. Most cases improve 5-10 days from onset but we have seen a small number of patients who have gotten worse after the 10 days.  Please be sure to watch for worsening symptoms and remain taking the proper precautions.   Go to the nearest hospital ED for assessment if fever/cough/breathlessness are severe or illness seems like a threat to life.    The following symptoms may appear 2-14 days after exposure: . Fever . Cough . Shortness of breath or difficulty breathing . Chills . Repeated shaking with chills . Muscle pain . Headache . Sore throat . New loss of taste or smell . Fatigue . Congestion or runny nose . Nausea or vomiting . Diarrhea  You have been enrolled in MyChart Home Monitoring for COVID-19. Daily you will receive a questionnaire within the MyChart website. Our COVID-19 response team will be monitoring your responses daily.  You can use medication such as A prescription cough medication called Tessalon Perles 100 mg. You may take 1-2 capsules every 8 hours as needed for cough, A prescription inhaler called Albuterol MDI 90 mcg /actuation 2 puffs every 4 hours as needed for shortness of breath,  wheezing, cough and A prescription for Fluticasone nasal spray 2 sprays in each nostril one time per day     You may also take acetaminophen (Tylenol) as needed for fever.  HOME CARE: . Only take medications as instructed by your medical team. . Drink plenty of fluids and get plenty of rest. . A steam or ultrasonic humidifier can help if you have congestion.   GET HELP RIGHT AWAY IF YOU HAVE EMERGENCY WARNING SIGNS.  Call 911 or proceed to your closest emergency facility if: . You develop worsening high fever. . Trouble breathing . Bluish lips or face . Persistent pain or pressure in the chest . New confusion . Inability to wake or stay awake . You cough up blood. . Your symptoms become more severe . Inability to hold down food or fluids  This list is not all possible symptoms. Contact your medical provider for any symptoms that are severe or concerning to you.    Your e-visit answers were reviewed by a board certified advanced clinical practitioner to complete your personal care plan.  Depending on the condition, your plan could have included both over the counter or prescription medications.  If there is a problem please reply once you have received a response from your provider.  Your safety is important to us.  If you have drug allergies check your prescription carefully.    You can use MyChart to ask questions   about today's visit, request a non-urgent call back, or ask for a work or school excuse for 24 hours related to this e-Visit. If it has been greater than 24 hours you will need to follow up with your provider, or enter a new e-Visit to address those concerns. You will get an e-mail in the next two days asking about your experience.  I hope that your e-visit has been valuable and will speed your recovery. Thank you for using e-visits.    Approximately 5 minutes was spent documenting and reviewing patient's chart.     

## 2020-03-23 ENCOUNTER — Encounter: Payer: Self-pay | Admitting: Family Medicine

## 2020-03-23 ENCOUNTER — Ambulatory Visit (INDEPENDENT_AMBULATORY_CARE_PROVIDER_SITE_OTHER): Payer: Self-pay | Admitting: Family Medicine

## 2020-03-23 ENCOUNTER — Other Ambulatory Visit: Payer: Self-pay

## 2020-03-23 VITALS — BP 115/71 | HR 56 | Ht 69.0 in | Wt 189.0 lb

## 2020-03-23 DIAGNOSIS — Z8639 Personal history of other endocrine, nutritional and metabolic disease: Secondary | ICD-10-CM

## 2020-03-23 DIAGNOSIS — Z7689 Persons encountering health services in other specified circumstances: Secondary | ICD-10-CM

## 2020-03-23 DIAGNOSIS — Z833 Family history of diabetes mellitus: Secondary | ICD-10-CM

## 2020-03-23 DIAGNOSIS — M542 Cervicalgia: Secondary | ICD-10-CM

## 2020-03-23 DIAGNOSIS — Z716 Tobacco abuse counseling: Secondary | ICD-10-CM

## 2020-03-23 MED ORDER — BACLOFEN 10 MG PO TABS
5.0000 mg | ORAL_TABLET | Freq: Three times a day (TID) | ORAL | 3 refills | Status: DC | PRN
Start: 2020-03-23 — End: 2020-10-14

## 2020-03-23 MED ORDER — MELOXICAM 15 MG PO TABS
7.5000 mg | ORAL_TABLET | Freq: Every day | ORAL | 6 refills | Status: DC | PRN
Start: 2020-03-23 — End: 2020-10-14
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
## 2020-03-23 NOTE — Progress Notes (Signed)
1 

## 2020-03-24 ENCOUNTER — Telehealth: Payer: Self-pay | Admitting: Family Medicine

## 2020-03-24 LAB — COMPREHENSIVE METABOLIC PANEL
AG Ratio: 2.1 (calc) (ref 1.0–2.5)
ALT: 49 U/L — ABNORMAL HIGH (ref 9–46)
AST: 28 U/L (ref 10–40)
Albumin: 4.8 g/dL (ref 3.6–5.1)
Alkaline phosphatase (APISO): 58 U/L (ref 36–130)
BUN: 9 mg/dL (ref 7–25)
CO2: 27 mmol/L (ref 20–32)
Calcium: 9.6 mg/dL (ref 8.6–10.3)
Chloride: 107 mmol/L (ref 98–110)
Creat: 1.28 mg/dL (ref 0.60–1.35)
Globulin: 2.3 g/dL (calc) (ref 1.9–3.7)
Glucose, Bld: 102 mg/dL — ABNORMAL HIGH (ref 65–99)
Potassium: 4.2 mmol/L (ref 3.5–5.3)
Sodium: 143 mmol/L (ref 135–146)
Total Bilirubin: 0.5 mg/dL (ref 0.2–1.2)
Total Protein: 7.1 g/dL (ref 6.1–8.1)

## 2020-03-24 LAB — LIPID PANEL
Cholesterol: 223 mg/dL — ABNORMAL HIGH (ref <200)
HDL: 54 mg/dL (ref >=40)
LDL Cholesterol (Calc): 153 mg/dL (calc) — ABNORMAL HIGH
Non-HDL Cholesterol (Calc): 169 mg/dL (calc) — ABNORMAL HIGH (ref <130)
Total CHOL/HDL Ratio: 4.1 (calc) (ref <5.0)
Triglycerides: 67 mg/dL (ref <150)

## 2020-03-24 LAB — CBC WITH DIFFERENTIAL/PLATELET
Absolute Monocytes: 384 cells/uL (ref 200–950)
Basophils Absolute: 48 cells/uL (ref 0–200)
Basophils Relative: 0.8 %
Eosinophils Absolute: 150 cells/uL (ref 15–500)
Eosinophils Relative: 2.5 %
HCT: 45.1 % (ref 38.5–50.0)
Hemoglobin: 14.5 g/dL (ref 13.2–17.1)
Lymphs Abs: 2256 cells/uL (ref 850–3900)
MCH: 27 pg (ref 27.0–33.0)
MCHC: 32.2 g/dL (ref 32.0–36.0)
MCV: 84 fL (ref 80.0–100.0)
MPV: 11.6 fL (ref 7.5–12.5)
Monocytes Relative: 6.4 %
Neutro Abs: 3162 cells/uL (ref 1500–7800)
Neutrophils Relative %: 52.7 %
Platelets: 217 10*3/uL (ref 140–400)
RBC: 5.37 10*6/uL (ref 4.20–5.80)
RDW: 13.3 % (ref 11.0–15.0)
Total Lymphocyte: 37.6 %
WBC: 6 10*3/uL (ref 3.8–10.8)

## 2020-03-24 LAB — HEMOGLOBIN A1C
Hgb A1c MFr Bld: 5.6 % of total Hgb (ref <5.7)
Mean Plasma Glucose: 114 mg/dL
eAG (mmol/L): 6.3 mmol/L

## 2020-03-24 LAB — VITAMIN D 25 HYDROXY (VIT D DEFICIENCY, FRACTURES): Vit D, 25-Hydroxy: 19 ng/mL — ABNORMAL LOW (ref 30–100)

## 2020-03-24 NOTE — Telephone Encounter (Signed)
Labs show:  Blood glucose is in prediabetes range at 102, but hemoglobin A1c is still normal at 5.6.  It is very important to exercise regularly and to minimize dietary intake of breads, pastas, cereals, sugars and sweets in order to decrease risk of becoming diabetic.  Liver enzyme (ALT) is almost normal now.  Recheck in 6 months.  Vitamin D is very low at 19 (target level is 50-80).  I recommend taking vitamin D3, 5000 IU tablets, two of them daily (total of 10,000 IU) for 3 months and then one daily (5,000 IU) long-term after that.  Recheck in 6 months.  Cholesterol panel should improve with lifestyle changes mentioned above.  All else looks good.

## 2020-03-25 NOTE — Telephone Encounter (Signed)
I called patient and left voice mail advising him to check the MyChart portal for a message from Dr. Prince Rome regarding his lab results. If he has issues getting into it, advised him to call me back and I will go through the results with him.

## 2020-04-10 ENCOUNTER — Other Ambulatory Visit: Payer: Self-pay

## 2020-04-10 ENCOUNTER — Encounter: Payer: Self-pay | Admitting: Family Medicine

## 2020-04-10 ENCOUNTER — Ambulatory Visit (INDEPENDENT_AMBULATORY_CARE_PROVIDER_SITE_OTHER): Payer: 59 | Admitting: Family Medicine

## 2020-04-10 DIAGNOSIS — M545 Low back pain, unspecified: Secondary | ICD-10-CM | POA: Diagnosis not present

## 2020-04-10 DIAGNOSIS — L600 Ingrowing nail: Secondary | ICD-10-CM

## 2020-04-10 NOTE — Progress Notes (Signed)
   Office Visit Note   Patient: Bobby Jenkins           Date of Birth: 1985-09-03           MRN: [id number] Visit Date: 04/10/2020 Requested by: Lavada Mesi, MD [address] [place],  [place] [address] PCP: Lavada Mesi, MD  Subjective: Chief Complaint  Patient presents with  . Lower Back - Pain    "knots" in lower back - hurts mainly with "turning the wrong way." Has low-grade pain in the lower back all the time, though.  . Right Great Toe - Pain    H/o ingrown toenail. Had it cut out once, but the pain removed. When the nail grows back, it puts pressure on that area, causing pain.    HPI: He is here with 2 concerns.  For about a year he has had intermittent pain in his lower back.  He has noticed subcutaneous nodules, 1 on the right and one on the left, that are occasionally painful and sometimes seem to get bigger and then smaller.  No radicular pain.  He also has chronically ingrown right great toenail.  He had the medial one third cut out once at the ER but he grew back again.  It hurts him on a daily basis.                ROS:   All other systems were reviewed and are negative.  Objective: Vital Signs: There were no vitals taken for this visit.  Physical Exam:  General:  Alert and oriented, in no acute distress. Pulm:  Breathing unlabored. Psy:  Normal mood, congruent affect.  Low back: He has subcutaneous nodule palpable on the right in the flank area, probably a centimeter diameter.  It is smooth and round and in the fatty layer, mildly tender to palpation today.  I am unable to palpate the left-sided nodule today. Right great toe: He has ingrown medial toenail with no active infection.   Imaging: No results found.  Assessment & Plan: 1.  Intermittent low back pain with subcutaneous nodules, most likely benign.  Pain could be related to vitamin D deficiency. -He will start supplementation.  If pain persist, he will notify me.  2.  Ingrown right great  toenail -He desires a more permanent solution so I recommended referral to Dr. Lajoyce Corners for removal and possible destruction of nail matrix.     Procedures: No procedures performed        PMFS History: There are no problems to display for this patient.  History reviewed. No pertinent past medical history.  Family History  Problem Relation Age of Onset  . Diabetes Father   . Kidney disease Father   . Hypertension Father   . Healthy Sister   . Healthy Brother   . Diabetes Paternal Uncle   . Kidney disease Paternal Uncle   . Healthy Brother   . Healthy Sister   . Cancer Neg Hx     Past Surgical History:  Procedure Laterality Date  . NO PAST SURGERIES     Social History   Occupational History  . Not on file  Tobacco Use  . Smoking status: Current Every Day Smoker    Types: Cigarettes  . Smokeless tobacco: Never Used  Substance and Sexual Activity  . Alcohol use: Not Currently  . Drug use: Never  . Sexual activity: Not Currently

## 2020-04-16 ENCOUNTER — Ambulatory Visit: Payer: 59 | Admitting: Orthopedic Surgery

## 2020-04-20 ENCOUNTER — Encounter: Payer: Self-pay | Admitting: Orthopedic Surgery

## 2020-04-20 ENCOUNTER — Ambulatory Visit: Payer: 59 | Admitting: Orthopedic Surgery

## 2020-04-20 VITALS — Ht 69.0 in | Wt 175.0 lb

## 2020-04-20 DIAGNOSIS — L03031 Cellulitis of right toe: Secondary | ICD-10-CM | POA: Diagnosis not present

## 2020-04-21 ENCOUNTER — Encounter: Payer: Self-pay | Admitting: Orthopedic Surgery

## 2020-04-21 NOTE — Progress Notes (Signed)
Office Visit Note   Patient: Bobby Jenkins           Date of Birth: 1985-05-12           MRN: [id number] Visit Date: 04/20/2020              Requested by: Lavada Mesi, MD [address] [place],  [place] [address] PCP: Lavada Mesi, MD  Chief Complaint  Patient presents with  . Right Foot - Pain      HPI: Patient is a 35 year old gentleman who presents with an infected ingrown toenail on the right patient complains of pain and redness and swelling over the medial aspect of the right great toenail.  Patient states he has had the toenail excised previously.  Assessment & Plan: Visit Diagnoses:  1. Paronychia of great toe of right foot     Plan: The nail was excised without problems he was placed in a postoperative shoe who will discontinue the postoperative dressing tomorrow start antibiotic ointment and a Band-Aid dressing change daily follow-up if there is any recurrence of the infection.  Follow-Up Instructions: Return if symptoms worsen or fail to improve.   Ortho Exam  Patient is alert, oriented, no adenopathy, well-dressed, normal affect, normal respiratory effort. Examination patient has good pulses he has cellulitis over the medial border of the right great toe but there is tenderness to palpation he has an ingrown toenail.  After informed consent patient's great toe was prepped using Betadine paint.  Patient underwent a digital block with 10 cc of 1% lidocaine plain after adequate levels anesthesia were obtained a freer elevator was used to elevate the medial edge of the nail and scissors were used to resect the medial border of the right great toenail this was removed in 1 block of tissue the area was cleansed decompressed and a sterile dressing applied.  Patient was placed in a postoperative shoe.  Imaging: No results found. No images are attached to the encounter.  Labs: Lab Results  Component Value Date   HGBA1C 5.6 03/23/2020     Lab Results  Component  Value Date   ALBUMIN 4.4 11/23/2019   ALBUMIN 4.0 03/09/2019    No results found for: MG Lab Results  Component Value Date   VD25OH 19 (L) 03/23/2020    No results found for: PREALBUMIN CBC EXTENDED Latest Ref Rng & Units 03/23/2020 12/02/2019 11/23/2019  WBC 3.8 - 10.8 Thousand/uL 6.0 7.1 13.3(H)  RBC 4.20 - 5.80 Million/uL 5.37 5.60 5.61  HGB 13.2 - 17.1 g/dL 62.7 03.5 00.9  HCT 38.1 - 50.0 % 45.1 45.7 47.5  PLT 140 - 400 Thousand/uL 217 252 220  NEUTROABS 1,500 - 7,800 cells/uL 3,162 4.6 10.4(H)  LYMPHSABS 850 - 3,900 cells/uL 2,256 2.0 1.9     Body mass index is 25.84 kg/m.  Orders:  No orders of the defined types were placed in this encounter.  No orders of the defined types were placed in this encounter.    Procedures: No procedures performed  Clinical Data: No additional findings.  ROS:  All other systems negative, except as noted in the HPI. Review of Systems  Objective: Vital Signs: Ht 5\' 9"  (1.753 m)   Wt 175 lb (79.4 kg)   BMI 25.84 kg/m   Specialty Comments:  No specialty comments available.  PMFS History: There are no problems to display for this patient.  History reviewed. No pertinent past medical history.  Family History  Problem Relation Age of Onset  . Diabetes  Father   . Kidney disease Father   . Hypertension Father   . Healthy Sister   . Healthy Brother   . Diabetes Paternal Uncle   . Kidney disease Paternal Uncle   . Healthy Brother   . Healthy Sister   . Cancer Neg Hx     Past Surgical History:  Procedure Laterality Date  . NO PAST SURGERIES     Social History   Occupational History  . Not on file  Tobacco Use  . Smoking status: Current Every Day Smoker    Types: Cigarettes  . Smokeless tobacco: Never Used  Substance and Sexual Activity  . Alcohol use: Not Currently  . Drug use: Never  . Sexual activity: Not Currently

## 2020-07-22 ENCOUNTER — Emergency Department (HOSPITAL_COMMUNITY)
Admission: EM | Admit: 2020-07-22 | Discharge: 2020-07-23 | Disposition: A | Discharge status: Home / Self Care | Payer: 59 | Attending: Emergency Medicine | Admitting: Emergency Medicine

## 2020-07-22 ENCOUNTER — Emergency Department (HOSPITAL_COMMUNITY): Payer: 59

## 2020-07-22 ENCOUNTER — Other Ambulatory Visit: Payer: Self-pay

## 2020-07-22 ENCOUNTER — Encounter (HOSPITAL_COMMUNITY): Payer: Self-pay | Admitting: Emergency Medicine

## 2020-07-22 DIAGNOSIS — R519 Headache, unspecified: Secondary | ICD-10-CM | POA: Diagnosis present

## 2020-07-22 DIAGNOSIS — Z8616 Personal history of COVID-19: Secondary | ICD-10-CM | POA: Insufficient documentation

## 2020-07-22 DIAGNOSIS — Z28310 Unvaccinated for covid-19: Secondary | ICD-10-CM | POA: Diagnosis not present

## 2020-07-22 DIAGNOSIS — R109 Unspecified abdominal pain: Secondary | ICD-10-CM | POA: Diagnosis not present

## 2020-07-22 DIAGNOSIS — U071 COVID-19: Secondary | ICD-10-CM | POA: Insufficient documentation

## 2020-07-22 DIAGNOSIS — F17210 Nicotine dependence, cigarettes, uncomplicated: Secondary | ICD-10-CM | POA: Insufficient documentation

## 2020-07-22 LAB — CBC WITH DIFFERENTIAL/PLATELET
Abs Immature Granulocytes: 0.04 10*3/uL (ref 0.00–0.07)
Basophils Absolute: 0.1 10*3/uL (ref 0.0–0.1)
Basophils Relative: 1 %
Eosinophils Absolute: 0.1 10*3/uL (ref 0.0–0.5)
Eosinophils Relative: 1 %
HCT: 48.1 % (ref 39.0–52.0)
Hemoglobin: 15.3 g/dL (ref 13.0–17.0)
Immature Granulocytes: 0 %
Lymphocytes Relative: 4 %
Lymphs Abs: 0.4 10*3/uL — ABNORMAL LOW (ref 0.7–4.0)
MCH: 27.3 pg (ref 26.0–34.0)
MCHC: 31.8 g/dL (ref 30.0–36.0)
MCV: 85.7 fL (ref 80.0–100.0)
Monocytes Absolute: 0.8 10*3/uL (ref 0.1–1.0)
Monocytes Relative: 8 %
Neutro Abs: 8.5 10*3/uL — ABNORMAL HIGH (ref 1.7–7.7)
Neutrophils Relative %: 86 %
Platelets: 206 10*3/uL (ref 150–400)
RBC: 5.61 MIL/uL (ref 4.22–5.81)
RDW: 13.4 % (ref 11.5–15.5)
WBC: 9.9 10*3/uL (ref 4.0–10.5)
nRBC: 0 % (ref 0.0–0.2)

## 2020-07-22 NOTE — ED Provider Notes (Signed)
Emergency Medicine Provider Triage Evaluation Note  Roddrick Sharron , a 35 y.o. male  was evaluated in triage.  Pt complains of body aches, headache, abdominal pain, nausea, and vomiting that began today after showering.  Denies fever but reports chills all day.  Has had cough, improved with OTC meds.  Denies sick contacts.  Unvaccinated for covid but had it in November 2021.  Review of Systems  Positive: Body aches, cough, abdominal pain, vomiting Negative: Diarrhea, fever, chest pain  Physical Exam  BP 119/68 (BP Location: Right Arm)   Pulse 74   Temp 99.6 F (37.6 C) (Oral)   Resp 18   SpO2 96%  Gen:   Awake, no distress   Resp:  Normal effort, faint expiratory wheeze noted, no acute distress MSK:   Moves extremities without difficulty   Medical Decision Making  Medically screening exam initiated at 10:45 PM.  Appropriate orders placed.  Aldrich Lloyd was informed that the remainder of the evaluation will be completed by another provider, this initial triage assessment does not replace that evaluation, and the importance of remaining in the ED until their evaluation is complete.  Labs, CXR, covid screen ordered.   Garlon Hatchet, PA-C 07/22/20 [id number]    Zadie Rhine, MD 07/23/20 [phone]

## 2020-07-22 NOTE — ED Triage Notes (Signed)
Pt reports fevers, chills, n/v, headaches, abd pain, sob and back pain that started tonight. Pt denies any covid contacts.

## 2020-07-23 LAB — COMPREHENSIVE METABOLIC PANEL
ALT: 51 U/L — ABNORMAL HIGH (ref 0–44)
AST: 29 U/L (ref 15–41)
Albumin: 4.5 g/dL (ref 3.5–5.0)
Alkaline Phosphatase: 62 U/L (ref 38–126)
Anion gap: 9 (ref 5–15)
BUN: 8 mg/dL (ref 6–20)
CO2: 26 mmol/L (ref 22–32)
Calcium: 9.7 mg/dL (ref 8.9–10.3)
Chloride: 106 mmol/L (ref 98–111)
Creatinine, Ser: 1.37 mg/dL — ABNORMAL HIGH (ref 0.61–1.24)
GFR, Estimated: >60 mL/min (ref >60)
Glucose, Bld: 103 mg/dL — ABNORMAL HIGH (ref 70–99)
Potassium: 3.7 mmol/L (ref 3.5–5.1)
Sodium: 141 mmol/L (ref 135–145)
Total Bilirubin: 1 mg/dL (ref 0.3–1.2)
Total Protein: 7.3 g/dL (ref 6.5–8.1)

## 2020-07-23 LAB — RESP PANEL BY RT-PCR (FLU A&B, COVID) ARPGX2
Influenza A by PCR: NEGATIVE
Influenza B by PCR: NEGATIVE
SARS Coronavirus 2 by RT PCR: POSITIVE — AB

## 2020-07-23 LAB — URINALYSIS, ROUTINE W REFLEX MICROSCOPIC
Bilirubin Urine: NEGATIVE
Glucose, UA: NEGATIVE mg/dL
Hgb urine dipstick: NEGATIVE
Ketones, ur: NEGATIVE mg/dL
Leukocytes,Ua: NEGATIVE
Nitrite: NEGATIVE
Protein, ur: NEGATIVE mg/dL
Specific Gravity, Urine: 1.023 (ref 1.005–1.030)
pH: 5 (ref 5.0–8.0)

## 2020-07-23 LAB — LIPASE, BLOOD: Lipase: 27 U/L (ref 11–51)

## 2020-07-23 MED ORDER — IBUPROFEN 400 MG PO TABS
400.0000 mg | ORAL_TABLET | Freq: Once | ORAL | Status: AC
  Administered 2020-07-23: 400 mg via ORAL
  Filled 2020-07-23: qty 1

## 2020-07-23 NOTE — ED Provider Notes (Signed)
[facility] EMERGENCY DEPARTMENT Provider Note   CSN: [id number] Arrival date & time: 07/22/20  2231     History Chief Complaint  Patient presents with   Abdominal Pain   Fatigue    Bobby Jenkins is a 35 y.o. male.  The history is provided by the patient.  Patient reports of the past days had chills, myalgias, headache abdominal pain.  He also reports cough, vomiting and diarrhea.  His course is worsening.  Denies any fevers but reports chills.  Nothing improves his symptoms.  He is unvaccinated for COVID.  He had COVID back in 2021     PMH-none Past Surgical History:  Procedure Laterality Date   NO PAST SURGERIES         Family History  Problem Relation Age of Onset   Diabetes Father    Kidney disease Father    Hypertension Father    Healthy Sister    Healthy Brother    Diabetes Paternal Uncle    Kidney disease Paternal Uncle    Healthy Brother    Healthy Sister    Cancer Neg Hx     Social History   Tobacco Use   Smoking status: Every Day    Types: Cigarettes   Smokeless tobacco: Never  Substance Use Topics   Alcohol use: Not Currently   Drug use: Never    Home Medications Prior to Admission medications   Medication Sig Start Date End Date Taking? Authorizing Provider  albuterol (VENTOLIN HFA) 108 (90 Base) MCG/ACT inhaler INHALE 2 PUFFS INTO THE LUNGS EVERY 6 (SIX) HOURS AS NEEDED FOR WHEEZING OR SHORTNESS OF BREATH. 01/29/20 01/28/21  Jannifer Rodney A, FNP  baclofen (LIORESAL) 10 MG tablet Take 0.5-1 tablets (5-10 mg total) by mouth 3 (three) times daily as needed for muscle spasms. 03/23/20   Hilts, Casimiro Needle, MD  fluticasone (FLONASE) 50 MCG/ACT nasal spray PLACE 2 SPRAYS INTO BOTH NOSTRILS DAILY. 01/29/20 01/28/21  Jannifer Rodney A, FNP  ibuprofen (ADVIL) 800 MG tablet TAKE 1 TABLET (800 MG TOTAL) BY MOUTH EVERY 8 (EIGHT) HOURS AS NEEDED. 12/02/19 12/01/20  Claiborne Rigg, NP  meloxicam (MOBIC) 15 MG tablet Take 0.5-1 tablets (7.5-15 mg  total) by mouth daily as needed for pain. 03/23/20   Hilts, Casimiro Needle, MD  tizanidine (ZANAFLEX) 6 MG capsule TAKE 1 CAPSULE (6 MG TOTAL) BY MOUTH 3 (THREE) TIMES DAILY. 12/02/19 12/01/20  Claiborne Rigg, NP    Allergies    Patient has no known allergies.  Review of Systems   Review of Systems  Constitutional:  Positive for chills.  Respiratory:  Positive for cough. Negative for shortness of breath.   Cardiovascular:  Negative for chest pain.  Gastrointestinal:  Positive for diarrhea and vomiting.  All other systems reviewed and are negative.  Physical Exam Updated Vital Signs BP 107/65 (BP Location: Right Arm)   Pulse 63   Temp 99.6 F (37.6 C) (Oral)   Resp 16   Ht 1.753 m (5\' 9" )   Wt 74.8 kg   SpO2 99%   BMI 24.37 kg/m   Physical Exam CONSTITUTIONAL: Well developed/well nourished HEAD: Normocephalic/atraumatic EYES: EOMI/PERRL NECK: supple no meningeal signs SPINE/BACK:entire spine nontender CV: S1/S2 noted, no murmurs/rubs/gallops noted LUNGS: Lungs are clear to auscultation bilaterally, no apparent distress ABDOMEN: soft, nontender NEURO: Pt is awake/alert/appropriate, moves all extremitiesx4.  No facial droop.  GCS 15 EXTREMITIES: pulses normal/equal, full ROM SKIN: warm, color normal PSYCH: no abnormalities of mood noted, alert and oriented to situation  ED Results / Procedures / Treatments   Labs (all labs ordered are listed, but only abnormal results are displayed) Labs Reviewed  RESP PANEL BY RT-PCR (FLU A&B, COVID) ARPGX2 - Abnormal; Notable for the following components:      Result Value   SARS Coronavirus 2 by RT PCR POSITIVE (*)    All other components within normal limits  CBC WITH DIFFERENTIAL/PLATELET - Abnormal; Notable for the following components:   Neutro Abs 8.5 (*)    Lymphs Abs 0.4 (*)    All other components within normal limits  COMPREHENSIVE METABOLIC PANEL - Abnormal; Notable for the following components:   Glucose, Bld 103 (*)     Creatinine, Ser 1.37 (*)    ALT 51 (*)    All other components within normal limits  URINALYSIS, ROUTINE W REFLEX MICROSCOPIC - Abnormal; Notable for the following components:   APPearance HAZY (*)    All other components within normal limits  LIPASE, BLOOD    EKG None  Radiology DG Chest 2 View  Result Date: 07/22/2020 CLINICAL DATA:  Cough and fever, initial encounter EXAM: CHEST - 2 VIEW COMPARISON:  11/23/2019 FINDINGS: The heart size and mediastinal contours are within normal limits. Both lungs are clear. The visualized skeletal structures are unremarkable. IMPRESSION: No active cardiopulmonary disease. Electronically Signed   By: Alcide Clever M.D.   On: 07/22/2020 23:09    Procedures Procedures   Medications Ordered in ED Medications  ibuprofen (ADVIL) tablet 400 mg (has no administration in time range)    ED Course  I have reviewed the triage vital signs and the nursing notes.  Pertinent labs & imaging results that were available during my care of the patient were reviewed by me and considered in my medical decision making (see chart for details).    MDM Rules/Calculators/A&P                          Patient found to have COVID-19.  He is overall well-appearing.  No distress.  No hypoxia.  Chest x-ray was personally viewed and is negative. No indication for antivirals at this time.  Discussed symptomatic care.  We discussed strict return precautions Final Clinical Impression(s) / ED Diagnoses Final diagnoses:  COVID-19    Rx / DC Orders ED Discharge Orders     None        Zadie Rhine, MD 07/23/20 [phone]

## 2020-09-28 ENCOUNTER — Ambulatory Visit: Payer: 59 | Admitting: Family Medicine

## 2020-10-14 ENCOUNTER — Ambulatory Visit (INDEPENDENT_AMBULATORY_CARE_PROVIDER_SITE_OTHER): Payer: 59 | Admitting: Family Medicine

## 2020-10-14 ENCOUNTER — Other Ambulatory Visit: Payer: Self-pay

## 2020-10-14 ENCOUNTER — Encounter: Payer: Self-pay | Admitting: Family Medicine

## 2020-10-14 VITALS — BP 132/86 | HR 52 | Temp 96.6°F | Ht 69.0 in | Wt 186.0 lb

## 2020-10-14 DIAGNOSIS — M5410 Radiculopathy, site unspecified: Secondary | ICD-10-CM | POA: Diagnosis not present

## 2020-10-14 DIAGNOSIS — B359 Dermatophytosis, unspecified: Secondary | ICD-10-CM

## 2020-10-14 DIAGNOSIS — B353 Tinea pedis: Secondary | ICD-10-CM | POA: Diagnosis not present

## 2020-10-14 MED ORDER — PREDNISONE 20 MG PO TABS: ORAL_TABLET | ORAL | 0 refills | Status: DC

## 2020-10-14 MED ORDER — NYSTATIN 100000 UNIT/GM EX POWD: 1.0000 "application " | Freq: Three times a day (TID) | CUTANEOUS | 0 refills | Status: DC
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
## 2020-10-14 NOTE — Patient Instructions (Signed)
Twp prescriptions sent to pharmacy for your arm and feet

## 2020-11-01 ENCOUNTER — Ambulatory Visit
Admission: RE | Admit: 2020-11-01 | Discharge: 2020-11-01 | Disposition: A | Discharge status: Home / Self Care | Payer: 59 | Source: Ambulatory Visit | Attending: Family Medicine | Admitting: Family Medicine

## 2020-11-01 ENCOUNTER — Other Ambulatory Visit: Payer: Self-pay

## 2020-11-01 DIAGNOSIS — M5410 Radiculopathy, site unspecified: Secondary | ICD-10-CM

## 2020-11-04 ENCOUNTER — Other Ambulatory Visit: Payer: Self-pay | Admitting: *Deleted

## 2020-11-04 DIAGNOSIS — M5410 Radiculopathy, site unspecified: Secondary | ICD-10-CM

## 2021-02-03 ENCOUNTER — Telehealth: Payer: Self-pay | Admitting: *Deleted

## 2021-02-03 NOTE — Telephone Encounter (Signed)
Patient called and left message on Clinical intake stating that he had a Cervical Spine Exam in December and wants to know what to do.   Patient was referred to [facility], Dr. Julio Sicks. OV 11/12/20.   Tried calling patient back to get more information, LMOM to return call.

## 2021-02-05 NOTE — Telephone Encounter (Signed)
LMOM to return call.

## 2021-02-10 ENCOUNTER — Ambulatory Visit (INDEPENDENT_AMBULATORY_CARE_PROVIDER_SITE_OTHER): Payer: 59 | Admitting: Family Medicine

## 2021-02-10 ENCOUNTER — Other Ambulatory Visit: Payer: Self-pay

## 2021-02-10 ENCOUNTER — Encounter: Payer: Self-pay | Admitting: Family Medicine

## 2021-02-10 VITALS — BP 128/82 | HR 51 | Temp 97.7°F | Ht 69.0 in | Wt 183.4 lb

## 2021-02-10 DIAGNOSIS — M5410 Radiculopathy, site unspecified: Secondary | ICD-10-CM | POA: Diagnosis not present

## 2021-02-10 MED ORDER — TRAMADOL HCL 50 MG PO TABS: 50.0000 mg | ORAL_TABLET | Freq: Three times a day (TID) | ORAL | 0 refills | Status: AC | PRN

## 2021-02-10 MED ORDER — PREDNISONE 20 MG PO TABS: ORAL_TABLET | ORAL | 0 refills | Status: DC

## 2021-02-10 MED ORDER — CYCLOBENZAPRINE HCL 10 MG PO TABS: 10.0000 mg | ORAL_TABLET | Freq: Three times a day (TID) | ORAL | 0 refills | Status: DC | PRN

## 2021-02-10 NOTE — Progress Notes (Signed)
Provider:  Jacalyn Lefevre, MD  Careteam: Patient Care Team: Frederica Kuster, MD as PCP - General (Family Medicine)  PLACE OF SERVICE:  [facility]  Advanced Directive information    No Known Allergies  Chief Complaint  Patient presents with   Acute Visit    Patient presents today to discuss radiology report.     HPI: Patient is a 36 y.o. male patient continues with pain in neck and left upper back.  Less symptoms in left upper extremity.  Since his last visit here for same problem he is seen neurosurgery chiropractory.  Could not afford physical therapy at $300 a session.  He is working in a warehouse and doing lifting and notes that pain is worse at the end of a work shift.  Review of Systems:  Review of Systems  Musculoskeletal:  Positive for back pain and neck pain.  All other systems reviewed and are negative.  History reviewed. No pertinent past medical history. Past Surgical History:  Procedure Laterality Date   NO PAST SURGERIES     Social History:   reports that he has been smoking cigarettes. He has never used smokeless tobacco. He reports that he does not currently use alcohol. He reports that he does not use drugs.  Family History  Problem Relation Age of Onset   Anemia Mother    Diabetes Father    Kidney disease Father    Hypertension Father    Thyroid disease Father    Healthy Sister    Healthy Sister    Healthy Brother    Healthy Brother    Diabetes Paternal Uncle    Kidney disease Paternal Uncle    Cancer Neg Hx     Medications: Patient's Medications  New Prescriptions   CYCLOBENZAPRINE (FLEXERIL) 10 MG TABLET    Take 1 tablet (10 mg total) by mouth 3 (three) times daily as needed for muscle spasms.   PREDNISONE (DELTASONE) 20 MG TABLET    2 po at same time daily for 5 days   TRAMADOL (ULTRAM) 50 MG TABLET    Take 1 tablet (50 mg total) by mouth every 8 (eight) hours as needed for up to 5 days.  Previous Medications   No medications on  file  Modified Medications   No medications on file  Discontinued Medications   NYSTATIN (MYCOSTATIN/NYSTOP) POWDER    Apply 1 application topically 3 (three) times daily.   PREDNISONE (DELTASONE) 20 MG TABLET    2 po at same time daily for 5 days    Physical Exam:  Vitals:   02/10/21 0902  BP: 128/82  Pulse: (!) 51  Temp: 97.7 F (36.5 C)  SpO2: 98%  Weight: 183 lb 6.4 oz (83.2 kg)  Height: 5\' 9"  (1.753 m)   Body mass index is 27.08 kg/m. Wt Readings from Last 3 Encounters:  02/10/21 183 lb 6.4 oz (83.2 kg)  10/14/20 186 lb (84.4 kg)  07/22/20 165 lb (74.8 kg)    Physical Exam Vitals and nursing note reviewed.  Constitutional:      Appearance: Normal appearance.  Cardiovascular:     Rate and Rhythm: Normal rate.  Pulmonary:     Effort: Pulmonary effort is normal.  Musculoskeletal:        General: Normal range of motion.     Comments: Neck: Normal range of motion Tenderness generally over left trapezius Deep tendon reflexes are good in the upper extremity as is strength  Neurological:     Mental  Status: He is alert.    Labs reviewed: Basic Metabolic Panel: Recent Labs    03/23/20 0852 07/22/20 2255  NA 143 141  K 4.2 3.7  CL 107 106  CO2 27 26  GLUCOSE 102* 103*  BUN 9 8  CREATININE 1.28 1.37*  CALCIUM 9.6 9.7   Liver Function Tests: Recent Labs    03/23/20 0852 07/22/20 2255  AST 28 29  ALT 49* 51*  ALKPHOS  --  62  BILITOT 0.5 1.0  PROT 7.1 7.3  ALBUMIN  --  4.5   Recent Labs    07/22/20 2255  LIPASE 27   No results for input(s): AMMONIA in the last 8760 hours. CBC: Recent Labs    03/23/20 0852 07/22/20 2255  WBC 6.0 9.9  NEUTROABS 3,162 8.5*  HGB 14.5 15.3  HCT 45.1 48.1  MCV 84.0 85.7  PLT 217 206   Lipid Panel: Recent Labs    03/23/20 0852  CHOL 223*  HDL 54  LDLCALC 153*  TRIG 67  CHOLHDL 4.1   TSH: No results for input(s): TSH in the last 8760 hours. A1C: Lab Results  Component Value Date   HGBA1C 5.6  03/23/2020     Assessment/Plan  1. Radiculopathy affecting upper extremity  Symptoms have never improved and are now aggravated by his new job.  He is looking for other work that does not involve lifting.  Will refill Rx for prednisone and Flexeril and referral back to neurosurgery to see if he is a surgical candidate given findings on his MRI.  Jacalyn Lefevre, MD [facility] [phone]

## 2021-02-15 ENCOUNTER — Encounter: Payer: Self-pay | Admitting: *Deleted

## 2021-02-15 NOTE — Telephone Encounter (Signed)
error 

## 2021-10-11 ENCOUNTER — Other Ambulatory Visit: Payer: Self-pay

## 2021-10-11 ENCOUNTER — Emergency Department (HOSPITAL_COMMUNITY): Payer: Self-pay

## 2021-10-11 ENCOUNTER — Encounter (HOSPITAL_COMMUNITY): Payer: Self-pay | Admitting: Emergency Medicine

## 2021-10-11 ENCOUNTER — Emergency Department (HOSPITAL_COMMUNITY)
Admission: EM | Admit: 2021-10-11 | Discharge: 2021-10-11 | Discharge status: Against Medical Advice | Payer: Self-pay | Attending: Emergency Medicine | Admitting: Emergency Medicine

## 2021-10-11 DIAGNOSIS — R059 Cough, unspecified: Secondary | ICD-10-CM | POA: Insufficient documentation

## 2021-10-11 DIAGNOSIS — Z5321 Procedure and treatment not carried out due to patient leaving prior to being seen by health care provider: Secondary | ICD-10-CM | POA: Insufficient documentation

## 2021-10-11 DIAGNOSIS — R062 Wheezing: Secondary | ICD-10-CM | POA: Insufficient documentation

## 2021-10-11 DIAGNOSIS — Z20822 Contact with and (suspected) exposure to covid-19: Secondary | ICD-10-CM | POA: Insufficient documentation

## 2021-10-11 DIAGNOSIS — R079 Chest pain, unspecified: Secondary | ICD-10-CM | POA: Insufficient documentation

## 2021-10-11 LAB — CBC WITH DIFFERENTIAL/PLATELET
Abs Immature Granulocytes: 0.02 10*3/uL (ref 0.00–0.07)
Basophils Absolute: 0.1 10*3/uL (ref 0.0–0.1)
Basophils Relative: 1 %
Eosinophils Absolute: 0.2 10*3/uL (ref 0.0–0.5)
Eosinophils Relative: 2 %
HCT: 44.2 % (ref 39.0–52.0)
Hemoglobin: 14 g/dL (ref 13.0–17.0)
Immature Granulocytes: 0 %
Lymphocytes Relative: 48 %
Lymphs Abs: 3.8 10*3/uL (ref 0.7–4.0)
MCH: 27.3 pg (ref 26.0–34.0)
MCHC: 31.7 g/dL (ref 30.0–36.0)
MCV: 86.3 fL (ref 80.0–100.0)
Monocytes Absolute: 0.5 10*3/uL (ref 0.1–1.0)
Monocytes Relative: 6 %
Neutro Abs: 3.3 10*3/uL (ref 1.7–7.7)
Neutrophils Relative %: 43 %
Platelets: 208 10*3/uL (ref 150–400)
RBC: 5.12 MIL/uL (ref 4.22–5.81)
RDW: 13.4 % (ref 11.5–15.5)
WBC: 7.8 10*3/uL (ref 4.0–10.5)
nRBC: 0 % (ref 0.0–0.2)

## 2021-10-11 LAB — BASIC METABOLIC PANEL
Anion gap: 9 (ref 5–15)
BUN: 9 mg/dL (ref 6–20)
CO2: 26 mmol/L (ref 22–32)
Calcium: 9.2 mg/dL (ref 8.9–10.3)
Chloride: 106 mmol/L (ref 98–111)
Creatinine, Ser: 1.3 mg/dL — ABNORMAL HIGH (ref 0.61–1.24)
GFR, Estimated: >60 mL/min (ref >60)
Glucose, Bld: 117 mg/dL — ABNORMAL HIGH (ref 70–99)
Potassium: 3.8 mmol/L (ref 3.5–5.1)
Sodium: 141 mmol/L (ref 135–145)

## 2021-10-11 LAB — RESP PANEL BY RT-PCR (FLU A&B, COVID) ARPGX2
Influenza A by PCR: NEGATIVE
Influenza B by PCR: NEGATIVE
SARS Coronavirus 2 by RT PCR: NEGATIVE

## 2021-10-11 MED ORDER — IPRATROPIUM-ALBUTEROL 0.5-2.5 (3) MG/3ML IN SOLN
3.0000 mL | Freq: Once | RESPIRATORY_TRACT | Status: AC
  Administered 2021-10-11: 3 mL via RESPIRATORY_TRACT
  Filled 2021-10-11: qty 3

## 2021-10-11 NOTE — ED Notes (Signed)
Pt name called 3x for updated vitals, no response  

## 2021-10-11 NOTE — ED Provider Triage Note (Signed)
  Emergency Medicine Provider Triage Evaluation Note  MRN:  [id number]  Arrival date & time: 10/11/21    Medically screening exam initiated at 2:45 AM.   CC:   Cough / Chest Pain    HPI:  Bobby Jenkins is a 36 y.o. year-old male presents to the ED with chief complaint of cough and cold symptoms x 1 month.  States symptoms worsened today and he has had some wheezing.  Denies successful treatments.  History provided by patient. ROS:  -As included in HPI PE:   Vitals:   10/11/21 0243  BP: 111/71  Pulse: (!) 56  Resp: 16  Temp: 98.4 F (36.9 C)  SpO2: 97%    Non-toxic appearing No respiratory distress Coughing during exam MDM:  Based on signs and symptoms, CAP is highest on my differential, followed by URI. I've ordered labs and imaging in triage to expedite lab/diagnostic workup.  Patient was informed that the remainder of the evaluation will be completed by another provider, this initial triage assessment does not replace that evaluation, and the importance of remaining in the ED until their evaluation is complete.    Montine Circle, PA-C 10/11/21 0246

## 2021-10-11 NOTE — ED Triage Notes (Signed)
Patient reports persistent cough with pain at left side of chest when coughing , mild wheezing for 3 days .

## 2021-10-12 NOTE — Telephone Encounter (Signed)
Opened in error

## 2021-11-13 IMAGING — MR MR CERVICAL SPINE W/O CM
4 of 5 series · 29 of 48 positions shown · non-contrast
Comparison: Prior CT from 11/23/2019.

CLINICAL DATA: Initial evaluation for cervical radiculopathy,
left-sided neck pain, chest and arm pain.

EXAM:
MRI CERVICAL SPINE WITHOUT CONTRAST
TECHNIQUE: Multiplanar, multisequence MR imaging of the cervical spine was
performed. No intravenous contrast was administered.

[Series 2: T2 · sagittal · 3.0mm · 0.59mm/px · 8 of 15 slices shown (1 of 2)]
[im 1/15]
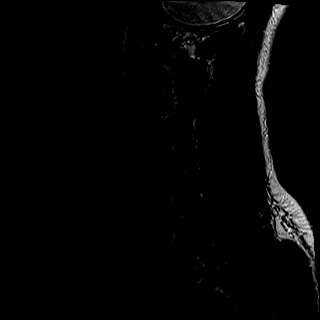
[im 3/15]
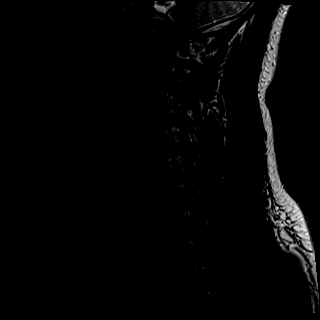
[im 5/15]
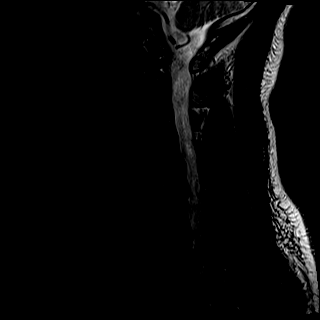
[im 7/15]
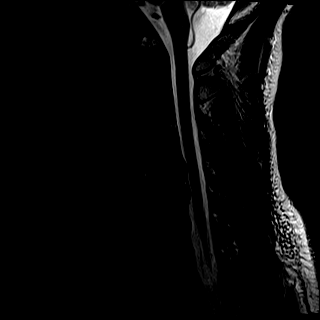
[im 9/15]
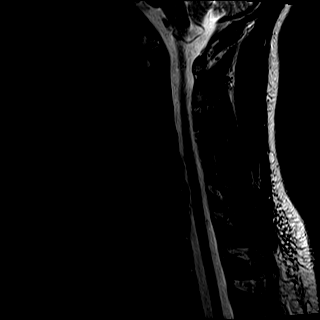
[im 11/15]
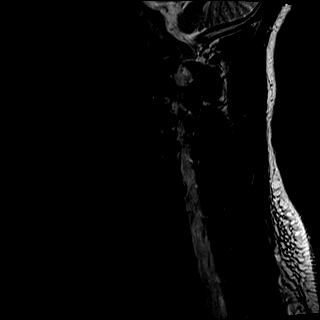
[im 13/15]
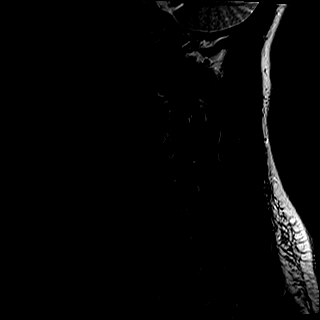
[im 15/15]
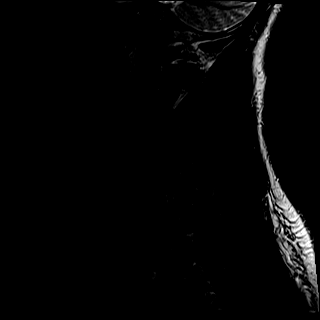

[Series 3: T1 · sagittal · 3.0mm · 0.37mm/px · 7 of 15 slices shown]
[im 1/15]
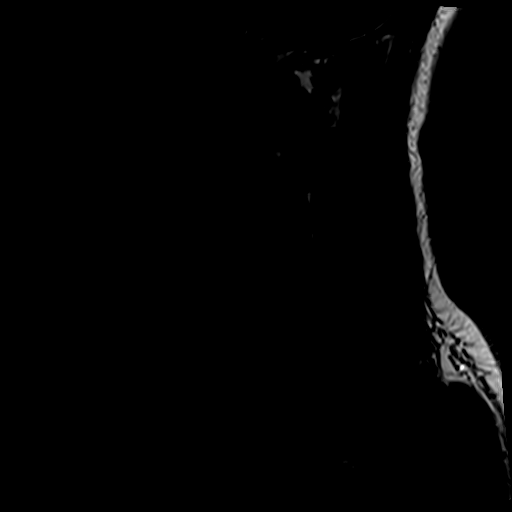
[im 3/15]
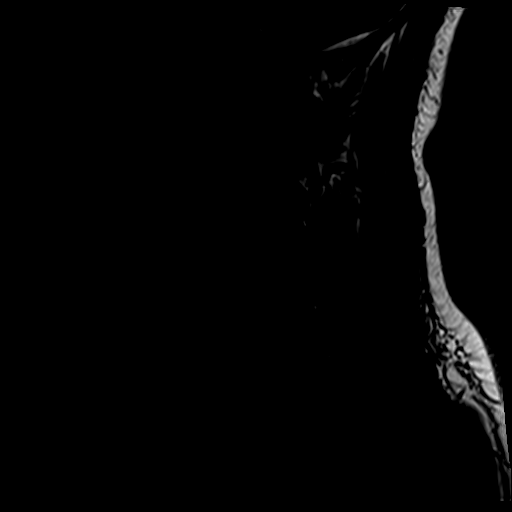
[im 5/15]
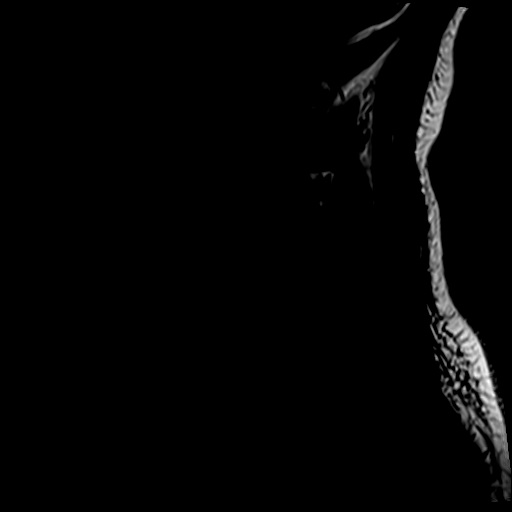
[im 8/15]
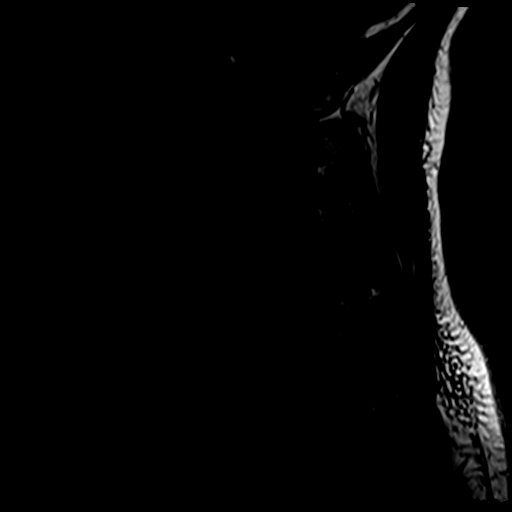
[im 10/15]
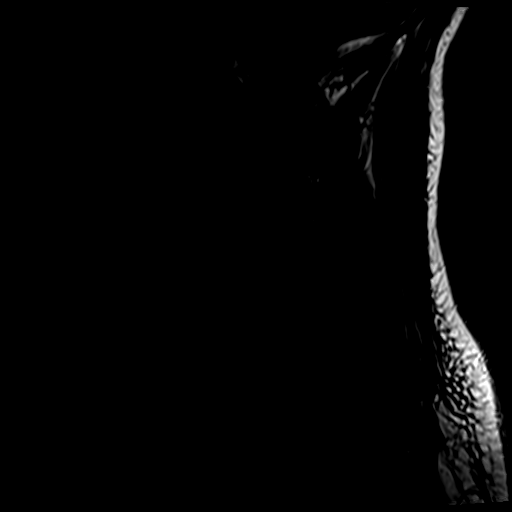
[im 12/15]
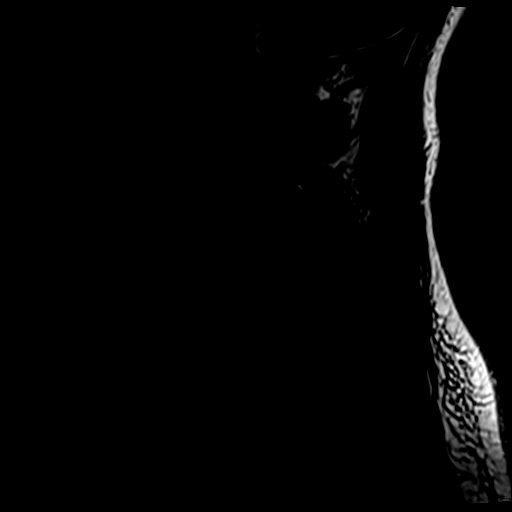
[im 15/15]
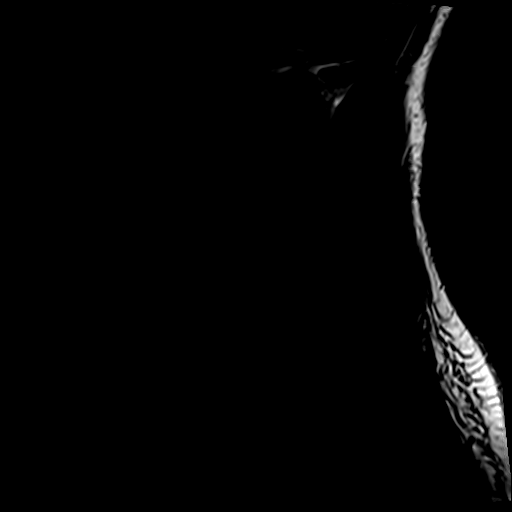

[Series 4: tir sag · sagittal · 3.0mm · 0.37mm/px · 5 of 15 slices shown]
[im 1/15]
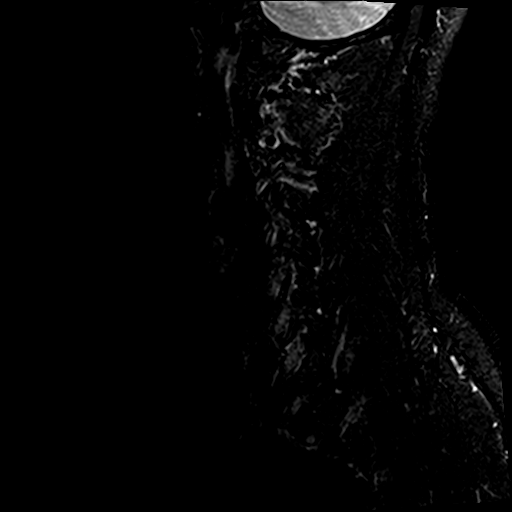
[im 3/15]
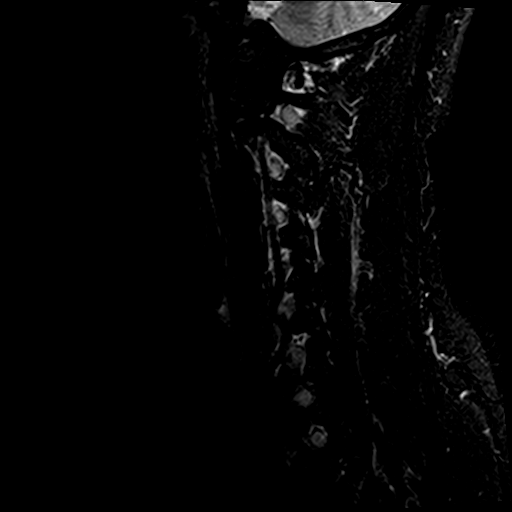
[im 5/15]
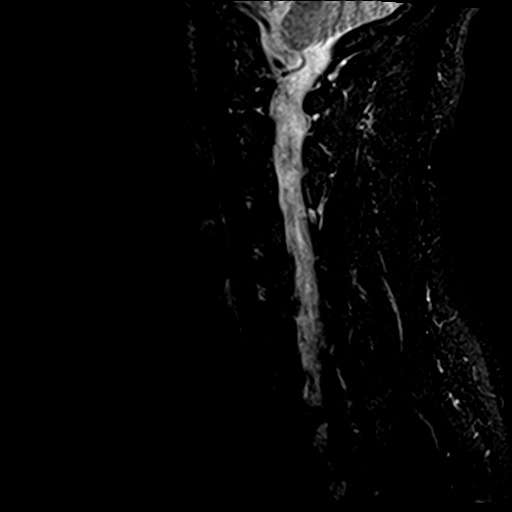
[im 8/15]
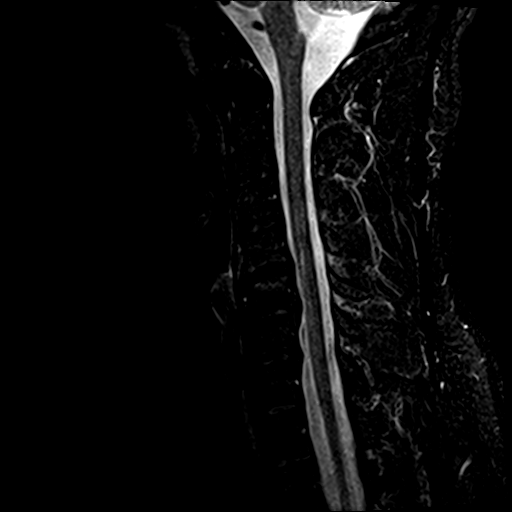
[im 12/15]
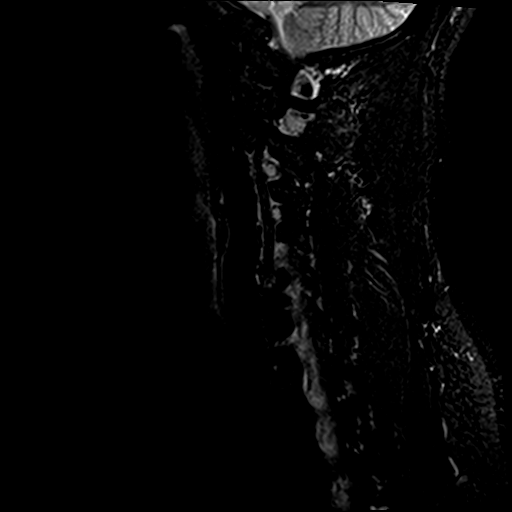

[Series 6: T2 · axial · 3.0mm · 0.70mm/px · z∈[-57,+40]mm · 9 of 26 slices shown (2 of 2)]
[im 1/26]
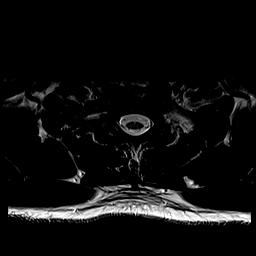
[im 5/26]
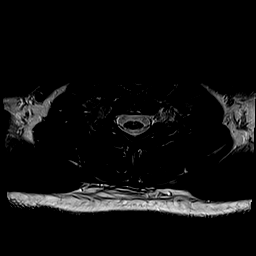
[im 9/26]
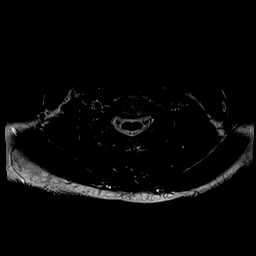
[im 11/26]
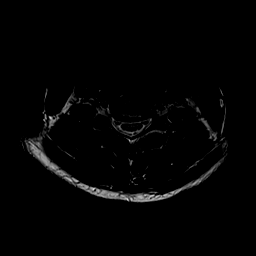
[im 13/26]
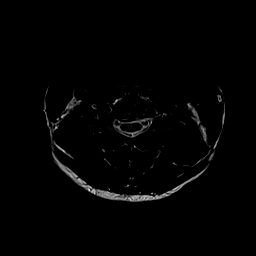
[im 15/26]
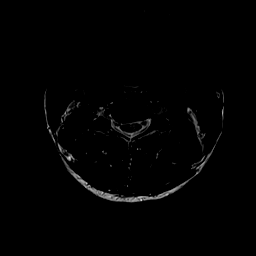
[im 17/26]
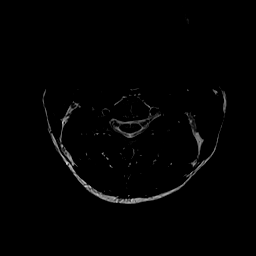
[im 21/26]
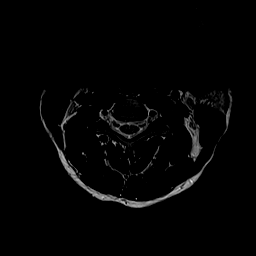
[im 26/26]
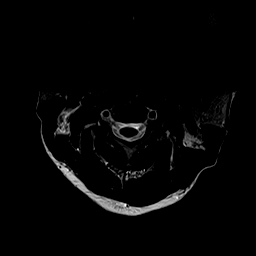

[29 of 48 positions shown; findings below may reference images not displayed]

FINDINGS: Alignment: Straightening of the normal cervical lordosis. No
listhesis.

Vertebrae: Vertebral body height maintained without acute or chronic
fracture. Bone marrow signal intensity within normal limits. No
discrete or worrisome osseous lesions. Prominent discogenic reactive
endplate change present about the C4-5 and C5-6 interspaces. No
other abnormal marrow edema.

Cord: Normal signal and morphology.

Posterior Fossa, vertebral arteries, paraspinal tissues: Visualized
brain and posterior fossa within normal limits. Craniocervical
junction normal. Paraspinous and prevertebral soft tissues within
normal limits. Normal intravascular flow voids seen within the
vertebral arteries bilaterally.

Disc levels:

C2-C3: Unremarkable.

C3-C4:  Unremarkable.

C4-C5: Broad-based right paracentral to subarticular disc protrusion
indents and flattens the right ventral thecal sac. Mild flattening
of the right ventral cord without cord signal changes. Mild
right-sided spinal stenosis. Foramina remain patent.

C5-C6: Degenerative intervertebral disc space narrowing with diffuse
disc bulge. Associated reactive endplate and uncovertebral spurring.
Broad posterior disc osteophyte flattens and partially faces the
ventral thecal sac with resultant mild spinal stenosis. Mild right
with mild-to-moderate left C6 foraminal stenosis.

C6-C7: Mild disc bulge with disc desiccation. No significant canal
or foraminal stenosis.

C7-T1:  Unremarkable.

Visualized upper thoracic spine demonstrates no significant finding.
IMPRESSION: 1. Broad-based right paracentral to subarticular disc protrusion at
C4-5 with resultant mild flattening of the right ventral cord and
mild right-sided spinal stenosis.
2. Degenerative disc osteophyte at C5-6 with resultant mild canal,
with mild right and mild-to-moderate left C6 foraminal narrowing.
3. Mild noncompressive disc bulging at C6-7 without stenosis.

## 2022-04-06 ENCOUNTER — Ambulatory Visit: Payer: Medicaid Other | Admitting: Family

## 2022-04-07 ENCOUNTER — Encounter: Payer: Self-pay | Admitting: Family

## 2022-04-07 ENCOUNTER — Ambulatory Visit (INDEPENDENT_AMBULATORY_CARE_PROVIDER_SITE_OTHER): Payer: Medicaid Other | Admitting: Family

## 2022-04-07 VITALS — BP 98/60 | HR 47 | Temp 97.5°F | Resp 16 | Ht 69.0 in | Wt 190.6 lb

## 2022-04-07 DIAGNOSIS — G44019 Episodic cluster headache, not intractable: Secondary | ICD-10-CM

## 2022-04-07 DIAGNOSIS — F17209 Nicotine dependence, unspecified, with unspecified nicotine-induced disorders: Secondary | ICD-10-CM

## 2022-04-07 MED ORDER — TOPIRAMATE 25 MG PO TABS: 25.0000 mg | ORAL_TABLET | Freq: Every day | ORAL | 3 refills | Status: AC

## 2022-04-07 MED ORDER — BUPROPION HCL ER (SR) 150 MG PO TB12: 150.0000 mg | ORAL_TABLET | Freq: Two times a day (BID) | ORAL | 3 refills | Status: AC
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
## 2022-04-13 ENCOUNTER — Encounter: Payer: Self-pay | Admitting: Neurology

## 2022-05-11 ENCOUNTER — Encounter: Payer: Self-pay | Admitting: Family Medicine

## 2022-05-11 ENCOUNTER — Ambulatory Visit (INDEPENDENT_AMBULATORY_CARE_PROVIDER_SITE_OTHER): Payer: Medicaid Other | Admitting: Family Medicine

## 2022-05-11 VITALS — BP 118/64 | HR 61 | Temp 97.8°F | Ht 69.0 in | Wt 185.6 lb

## 2022-05-11 DIAGNOSIS — Z13220 Encounter for screening for lipoid disorders: Secondary | ICD-10-CM

## 2022-05-11 DIAGNOSIS — Z131 Encounter for screening for diabetes mellitus: Secondary | ICD-10-CM

## 2022-05-11 DIAGNOSIS — M5410 Radiculopathy, site unspecified: Secondary | ICD-10-CM | POA: Diagnosis not present

## 2022-05-11 DIAGNOSIS — Z0000 Encounter for general adult medical examination without abnormal findings: Secondary | ICD-10-CM
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
## 2022-05-12 LAB — COMPREHENSIVE METABOLIC PANEL
AG Ratio: 2.2 (calc) (ref 1.0–2.5)
ALT: 54 U/L — ABNORMAL HIGH (ref 9–46)
AST: 30 U/L (ref 10–40)
Albumin: 4.6 g/dL (ref 3.6–5.1)
Alkaline phosphatase (APISO): 62 U/L (ref 36–130)
BUN: 9 mg/dL (ref 7–25)
CO2: 25 mmol/L (ref 20–32)
Calcium: 9.3 mg/dL (ref 8.6–10.3)
Chloride: 108 mmol/L (ref 98–110)
Creat: 1.22 mg/dL (ref 0.60–1.26)
Globulin: 2.1 g/dL (calc) (ref 1.9–3.7)
Glucose, Bld: 96 mg/dL (ref 65–99)
Potassium: 4.6 mmol/L (ref 3.5–5.3)
Sodium: 141 mmol/L (ref 135–146)
Total Bilirubin: 0.5 mg/dL (ref 0.2–1.2)
Total Protein: 6.7 g/dL (ref 6.1–8.1)

## 2022-05-12 LAB — LIPID PANEL
Cholesterol: 228 mg/dL — ABNORMAL HIGH (ref <200)
HDL: 47 mg/dL (ref >=40)
LDL Cholesterol (Calc): 159 mg/dL (calc) — ABNORMAL HIGH
Non-HDL Cholesterol (Calc): 181 mg/dL (calc) — ABNORMAL HIGH (ref <130)
Total CHOL/HDL Ratio: 4.9 (calc) (ref <5.0)
Triglycerides: 105 mg/dL (ref <150)

## 2022-06-08 ENCOUNTER — Encounter: Payer: Self-pay | Admitting: Family Medicine

## 2022-06-08 DIAGNOSIS — E785 Hyperlipidemia, unspecified: Secondary | ICD-10-CM | POA: Insufficient documentation
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
## 2022-08-22 ENCOUNTER — Ambulatory Visit: Payer: Medicaid Other | Admitting: Neurology

## 2022-08-22 ENCOUNTER — Encounter: Payer: Self-pay | Admitting: Neurology

## 2022-12-17 ENCOUNTER — Ambulatory Visit (HOSPITAL_COMMUNITY)
Admission: EM | Admit: 2022-12-17 | Discharge: 2022-12-17 | Disposition: A | Discharge status: Home / Self Care | Payer: Medicaid Other | Attending: Emergency Medicine | Admitting: Emergency Medicine

## 2022-12-17 ENCOUNTER — Encounter (HOSPITAL_COMMUNITY): Payer: Self-pay

## 2022-12-17 DIAGNOSIS — K5900 Constipation, unspecified: Secondary | ICD-10-CM | POA: Diagnosis not present

## 2022-12-17 DIAGNOSIS — K2100 Gastro-esophageal reflux disease with esophagitis, without bleeding: Secondary | ICD-10-CM

## 2022-12-17 MED ORDER — OMEPRAZOLE 20 MG PO CPDR: 20.0000 mg | DELAYED_RELEASE_CAPSULE | Freq: Every day | ORAL | 0 refills | Status: AC

## 2022-12-17 MED ORDER — LIDOCAINE VISCOUS HCL 2 % MT SOLN
15.0000 mL | Freq: Once | OROMUCOSAL | Status: AC
  Administered 2022-12-17: 15 mL via OROMUCOSAL

## 2022-12-17 MED ORDER — DOCUSATE SODIUM 100 MG PO CAPS: 100.0000 mg | ORAL_CAPSULE | Freq: Two times a day (BID) | ORAL | 0 refills | Status: AC

## 2022-12-17 MED ORDER — ALUM & MAG HYDROXIDE-SIMETH 200-200-20 MG/5ML PO SUSP
30.0000 mL | Freq: Once | ORAL | Status: AC
  Administered 2022-12-17: 30 mL via ORAL

## 2022-12-17 MED ORDER — LIDOCAINE VISCOUS HCL 2 % MT SOLN
OROMUCOSAL | Status: AC
  Filled 2022-12-17: qty 15

## 2022-12-17 MED ORDER — ALUM & MAG HYDROXIDE-SIMETH 200-200-20 MG/5ML PO SUSP
ORAL | Status: AC
  Filled 2022-12-17: qty 30

## 2022-12-17 NOTE — ED Provider Notes (Signed)
[facility]    CSN: [id number] Arrival date & time: 12/17/22  1020      History   Chief Complaint Chief Complaint  Patient presents with   Heartburn    HPI Bobby Jenkins is a 37 y.o. male.   Patient presents to clinic with complaints of small hard balls of stool, straining to have bowel movements and feeling of constipation for the past 3 years.  He also feels like he started to develop heartburn for the past 3 months.  The bowel movement issue has been ongoing but recently has been getting worse.  The bowel movements he has either, is little pebbles or his liquid.  He is always straining to get stool out.  Recently has been nauseous with his bowel movements.  He has tried Ex-Lax for this once.  Reports MiraLAX helped but then he went back to straining.   Around 3 months ago he went down to [place] and he ate something that triggered acid reflux and it has not gone away.  He has tried over-the-counter Tums without much relief.  Acid reflux is present throughout the day but worse when he lays down.  Describes a burning sensation in his esophagus.  He does have a primary care provider.  He has not addressed these issues with his PCP. No chest pain. No emesis. No fevers. No cough.   The history is provided by the patient and medical records.  Heartburn    History reviewed. No pertinent past medical history.  Patient Active Problem List   Diagnosis Date Noted   Hyperlipidemia 06/08/2022   Physical exam, annual 05/11/2022   Radiculopathy affecting upper extremity 10/14/2020   Tinea pedis 10/14/2020    Past Surgical History:  Procedure Laterality Date   NO PAST SURGERIES         Home Medications    Prior to Admission medications   Medication Sig Start Date End Date Taking? Authorizing Provider  docusate sodium (COLACE) 100 MG capsule Take 1 capsule (100 mg total) by mouth every 12 (twelve) hours. 12/17/22  Yes Rinaldo Ratel, [place] N, FNP  omeprazole (PRILOSEC)  20 MG capsule Take 1 capsule (20 mg total) by mouth daily. 12/17/22 01/16/23 Yes Rinaldo Ratel, [place] N, FNP  buPROPion (WELLBUTRIN SR) 150 MG 12 hr tablet Take 1 tablet (150 mg total) by mouth 2 (two) times daily. 04/07/22   Ngetich, Dinah C, NP  topiramate (TOPAMAX) 25 MG tablet Take 1 tablet (25 mg total) by mouth daily. 04/07/22   Ngetich, Donalee Citrin, NP    Family History Family History  Problem Relation Age of Onset   Anemia Mother    Diabetes Father    Kidney disease Father    Hypertension Father    Thyroid disease Father    Healthy Sister    Healthy Sister    Healthy Brother    Healthy Brother    Diabetes Paternal Uncle    Kidney disease Paternal Uncle    Cancer Neg Hx     Social History Social History   Tobacco Use   Smoking status: Former    Types: Cigarettes    Start date: 11/17/2022   Smokeless tobacco: Never  Vaping Use   Vaping status: Never Used  Substance Use Topics   Alcohol use: Not Currently   Drug use: Never     Allergies   Patient has no known allergies.   Review of Systems Review of Systems  Gastrointestinal:  Positive for heartburn.   Per HPI  Physical  Exam Triage Vital Signs ED Triage Vitals  Encounter Vitals Group     BP 12/17/22 1048 (!) 149/95     Systolic BP Percentile --      Diastolic BP Percentile --      Pulse Rate 12/17/22 1048 (!) 59     Resp 12/17/22 1048 16     Temp 12/17/22 1048 98.3 F (36.8 C)     Temp Source 12/17/22 1048 Oral     SpO2 12/17/22 1048 96 %     Weight 12/17/22 1047 185 lb (83.9 kg)     Height 12/17/22 1047 5\' 9"  (1.753 m)     Head Circumference --      Peak Flow --      Pain Score 12/17/22 1047 8     Pain Loc --      Pain Education --      Exclude from Growth Chart --    No data found.  Updated Vital Signs BP (!) 149/95 (BP Location: Left Arm)   Pulse (!) 59   Temp 98.3 F (36.8 C) (Oral)   Resp 16   Ht 5\' 9"  (1.753 m)   Wt 185 lb (83.9 kg)   SpO2 96%   BMI 27.32 kg/m   Visual Acuity Right  Eye Distance:   Left Eye Distance:   Bilateral Distance:    Right Eye Near:   Left Eye Near:    Bilateral Near:     Physical Exam Vitals and nursing note reviewed.  Constitutional:      Appearance: Normal appearance.  HENT:     Head: Normocephalic and atraumatic.     Right Ear: External ear normal.     Left Ear: External ear normal.     Nose: Nose normal.     Mouth/Throat:     Mouth: Mucous membranes are moist.  Eyes:     Conjunctiva/sclera: Conjunctivae normal.  Cardiovascular:     Rate and Rhythm: Normal rate.  Pulmonary:     Effort: Pulmonary effort is normal. No respiratory distress.  Abdominal:     General: Abdomen is flat. Bowel sounds are normal. There is no distension.     Palpations: Abdomen is soft. There is no mass.     Tenderness: There is abdominal tenderness in the right lower quadrant and left lower quadrant. There is no guarding or rebound.     Comments: Mild lower quadrant tenderness.  No rebound or guarding.  Musculoskeletal:        General: Normal range of motion.  Skin:    General: Skin is warm and dry.  Neurological:     General: No focal deficit present.     Mental Status: He is alert.  Psychiatric:        Mood and Affect: Mood normal.        Behavior: Behavior is cooperative.      UC Treatments / Results  Labs (all labs ordered are listed, but only abnormal results are displayed) Labs Reviewed - No data to display  EKG   Radiology No results found.  Procedures Procedures (including critical care time)  Medications Ordered in UC Medications  alum & mag hydroxide-simeth (MAALOX/MYLANTA) 200-200-20 MG/5ML suspension 30 mL (30 mLs Oral Given 12/17/22 1102)  lidocaine (XYLOCAINE) 2 % viscous mouth solution 15 mL (15 mLs Mouth/Throat Given 12/17/22 1102)    Initial Impression / Assessment and Plan / UC Course  I have reviewed the triage vital signs and the nursing notes.  Pertinent labs &  imaging results that were available during my  care of the patient were reviewed by me and considered in my medical decision making (see chart for details).  Vitals and triage reviewed, patient is hemodynamically stable.  Abdomen is soft with active bowel sounds, mild left lower quadrant tenderness.  Without rebound or guarding, low concern for acute abdomen at this time.  Symptoms consistent with constipation, advised stool softener and increase of fiber.  Heartburn improving with GI cocktail tremendously.  Will start on omeprazole and advised dietary modifications.  PCP follow-up highly recommended.  Plan of care, follow-up care return precautions given, no questions at this time.     Final Clinical Impressions(s) / UC Diagnoses   Final diagnoses:  Constipation, unspecified constipation type  Gastroesophageal reflux disease with esophagitis without hemorrhage     Discharge Instructions      Please start the Colace and omeprazole as prescribed.  For the acid reflux avoid spicy, fried or acidic foods.  You can also eat smaller more frequent meals and stay upright after meals.  Avoid alcohol.  The Colace will help soften your stool, to avoid straining you can add an over-the-counter fiber supplement to your diet or add more dietary fiber with green leafy vegetables, fresh fruits and beans.  Please follow-up with your primary care within the next month for reevaluation of these issues.  Return to clinic for any new or urgent symptoms.     ED Prescriptions     Medication Sig Dispense Auth. Provider   docusate sodium (COLACE) 100 MG capsule Take 1 capsule (100 mg total) by mouth every 12 (twelve) hours. 60 capsule Rinaldo Ratel, [place] N, [place]   omeprazole (PRILOSEC) 20 MG capsule Take 1 capsule (20 mg total) by mouth daily. 30 capsule Neng Albee, [place] N, [place]      PDMP not reviewed this encounter.   Carinna Newhart, [place] N, [place] 12/17/22 [phone]

## 2022-12-17 NOTE — Discharge Instructions (Signed)
Please start the Colace and omeprazole as prescribed.  For the acid reflux avoid spicy, fried or acidic foods.  You can also eat smaller more frequent meals and stay upright after meals.  Avoid alcohol.  The Colace will help soften your stool, to avoid straining you can add an over-the-counter fiber supplement to your diet or add more dietary fiber with green leafy vegetables, fresh fruits and beans.  Please follow-up with your primary care within the next month for reevaluation of these issues.  Return to clinic for any new or urgent symptoms.

## 2022-12-17 NOTE — ED Triage Notes (Signed)
Patient here today with c/o hard stool and frequent BM for the past 3 years and over the past 3 months he started developing heartburn. He has tried taking Tum and a laxative with no relief. He noticed that he had some pressure on the left side of his abd since yesterday and nausea X 2-3 weeks.

## 2023-08-24 ENCOUNTER — Encounter: Payer: Self-pay | Admitting: Podiatry

## 2023-08-24 ENCOUNTER — Ambulatory Visit (INDEPENDENT_AMBULATORY_CARE_PROVIDER_SITE_OTHER): Admitting: Podiatry

## 2023-08-24 DIAGNOSIS — L600 Ingrowing nail: Secondary | ICD-10-CM | POA: Diagnosis not present

## 2023-08-24 NOTE — Patient Instructions (Signed)

## 2023-08-25 NOTE — Progress Notes (Signed)
 Subjective:   Patient ID: Bobby Jenkins, male   DOB: 38 y.o.   MRN: [id number]   HPI Patient presents with chronic ingrown toenail deformity of the hallux bilateral medial border stating that they have been very sore he has tried to trim he has tried to soak and it has not been effective for him and making it hard to wear shoe gear.  He has not noted active drainage and patient does not smoke and likes to be active   Review of Systems  All other systems reviewed and are negative.       Objective:  Physical Exam Vitals and nursing note reviewed.  Constitutional:      Appearance: He is well-developed.  Pulmonary:     Effort: Pulmonary effort is normal.  Musculoskeletal:        General: Normal range of motion.  Skin:    General: Skin is warm.  Neurological:     Mental Status: He is alert.     Neurovascular status intact muscle strength found to be adequate range of motion adequate with the patient noted to have significant incurvation of the medial border right hallux and left hallux very painful with thickness of the nailbed and no indication of digital perfusion issues or neurological or other issues.  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  At this point I allowed him to read and then signed consent form understanding risk and I infiltrated each big toe 60 mg Xylocaine Marcaine mixture sterile prep done using sterile instrumentation removed the hallux nail borders exposed matrix applied phenol 3 applications 30 seconds followed by alcohol lavage sterile dressing gave instructions on soaks wear dressing 24 hours take it off earlier if throbbing were to occur and encouraged him to call us  with questions concerns which may arise during healing
